# Patient Record
Sex: Male | Born: 1940 | Race: White | Hispanic: No | Marital: Married | Smoking: Former smoker
Health system: Southern US, Community
[De-identification: ages and names within clinical notes are randomized; demographics above are authoritative.]

## PROBLEM LIST (undated history)

## (undated) DIAGNOSIS — I10 Essential (primary) hypertension: Secondary | ICD-10-CM

## (undated) DIAGNOSIS — H269 Unspecified cataract: Secondary | ICD-10-CM

## (undated) DIAGNOSIS — E119 Type 2 diabetes mellitus without complications: Secondary | ICD-10-CM

## (undated) HISTORY — PX: APPENDECTOMY: SHX54

## (undated) HISTORY — DX: Essential (primary) hypertension: I10

## (undated) HISTORY — DX: Unspecified cataract: H26.9

## (undated) HISTORY — DX: Type 2 diabetes mellitus without complications: E11.9

## (undated) HISTORY — PX: EYE SURGERY: SHX253

---

## 2012-08-23 ENCOUNTER — Ambulatory Visit (INDEPENDENT_AMBULATORY_CARE_PROVIDER_SITE_OTHER): Payer: Medicare Other | Admitting: Emergency Medicine

## 2012-08-23 VITALS — BP 162/70 | HR 78 | Temp 98.2°F | Resp 16 | Ht 71.5 in | Wt 201.2 lb

## 2012-08-23 DIAGNOSIS — I10 Essential (primary) hypertension: Secondary | ICD-10-CM

## 2012-08-23 DIAGNOSIS — R040 Epistaxis: Secondary | ICD-10-CM

## 2012-08-23 NOTE — Progress Notes (Signed)
[facility] [address], [place] [address] [phone]  Date:  08/23/2012   Name:  Carlos Leach   DOB:  03/06/1941   MRN:  [id number]  PCP:  No primary provider on file.    Chief Complaint: Epistaxis   History of Present Illness:  Carlos Leach is a 72 y.o. very pleasant male patient who presents with the following:  History of chronic nose bleeds on right side.  Has experienced daily bleeds for this week.  Cauterized once as a child.  Uses ocean nasal spray daily.  No history of trauma.  No antecedent infection.  No increase in bleeding or bruising tendency   There is no problem list on file for this patient.   Past Medical History  Diagnosis Date  . Cataract   . Diabetes mellitus without complication   . Hypertension     Past Surgical History  Procedure Laterality Date  . Appendectomy    . Eye surgery      History  Substance Use Topics  . Smoking status: Former Games developer  . Smokeless tobacco: Not on file  . Alcohol Use: Yes    Family History  Problem Relation Age of Onset  . Heart disease Father     No Known Allergies  Medication list has been reviewed and updated.  No current outpatient prescriptions on file prior to visit.   No current facility-administered medications on file prior to visit.    Review of Systems:  As per HPI, otherwise negative.    Physical Examination: Filed Vitals:   08/23/12 1304  BP: 162/70  Pulse: 78  Temp: 98.2 F (36.8 C)  Resp: 16   Filed Vitals:   08/23/12 1304  Height: 5' 11.5" (1.816 m)  Weight: 201 lb 3.2 oz (91.264 kg)   Body mass index is 27.67 kg/(m^2). Ideal Body Weight: Weight in (lb) to have BMI = 25: 181.4   GEN: WDWN, NAD, Non-toxic, Alert & Oriented x 3 HEENT: Atraumatic, Normocephalic.  Ears and Nose: No external deformity.  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  PSYCH: Normally interactive. Conversant.  Not depressed or anxious appearing.  Calm demeanor.    Assessment and Plan: Anterior nose bleed Cauterized with silver nitrate  Carmelina Dane, MD

## 2012-08-23 NOTE — Patient Instructions (Addendum)

## 2013-06-07 ENCOUNTER — Encounter: Payer: Self-pay | Admitting: Podiatry

## 2013-06-07 ENCOUNTER — Ambulatory Visit (INDEPENDENT_AMBULATORY_CARE_PROVIDER_SITE_OTHER): Payer: Medicare Other | Admitting: Podiatry

## 2013-06-07 VITALS — BP 154/72 | HR 85 | Resp 16

## 2013-06-07 DIAGNOSIS — B351 Tinea unguium: Secondary | ICD-10-CM

## 2013-06-07 DIAGNOSIS — M79609 Pain in unspecified limb: Secondary | ICD-10-CM

## 2013-06-07 NOTE — Progress Notes (Signed)
Carlos Leach presents today with a chief complaint of pain toenails one through 5 bilateral.  Objective: Vital signs are stable he is alert and oriented x3 pulses are palpable bilateral. Nails are thick yellow dystrophic onychomycotic and painful palpation as well as debridement.  Assessment: Pain in limb secondary to onychomycosis 1 through 5 bilateral.  Plan: Debridement of nails 1 through 5 bilateral is cover service secondary to pain. Followup with him in 3 months.

## 2013-06-07 NOTE — Patient Instructions (Signed)
Diabetes and Foot Care Diabetes may cause you to have problems because of poor blood supply (circulation) to your feet and legs. This may cause the skin on your feet to become thinner, break easier, and heal more slowly. Your skin may become dry, and the skin may peel and crack. You may also have nerve damage in your legs and feet causing decreased feeling in them. You may not notice minor injuries to your feet that could lead to infections or more serious problems. Taking care of your feet is one of the most important things you can do for yourself.  HOME CARE INSTRUCTIONS  Wear shoes at all times, even in the house. Do not go barefoot. Bare feet are easily injured.  Check your feet daily for blisters, cuts, and redness. If you cannot see the bottom of your feet, use a mirror or ask someone for help.  Wash your feet with warm water (do not use hot water) and mild soap. Then pat your feet and the areas between your toes until they are completely dry. Do not soak your feet as this can dry your skin.  Apply a moisturizing lotion or petroleum jelly (that does not contain alcohol and is unscented) to the skin on your feet and to dry, brittle toenails. Do not apply lotion between your toes.  Trim your toenails straight across. Do not dig under them or around the cuticle. File the edges of your nails with an emery board or nail file.  Do not cut corns or calluses or try to remove them with medicine.  Wear clean socks or stockings every day. Make sure they are not too tight. Do not wear knee-high stockings since they may decrease blood flow to your legs.  Wear shoes that fit properly and have enough cushioning. To break in new shoes, wear them for just a few hours a day. This prevents you from injuring your feet. Always look in your shoes before you put them on to be sure there are no objects inside.  Do not cross your legs. This may decrease the blood flow to your feet.  If you find a minor scrape,  cut, or break in the skin on your feet, keep it and the skin around it clean and dry. These areas may be cleansed with mild soap and water. Do not cleanse the area with peroxide, alcohol, or iodine.  When you remove an adhesive bandage, be sure not to damage the skin around it.  If you have a wound, look at it several times a day to make sure it is healing.  Do not use heating pads or hot water bottles. They may burn your skin. If you have lost feeling in your feet or legs, you may not know it is happening until it is too late.  Make sure your health care provider performs a complete foot exam at least annually or more often if you have foot problems. Report any cuts, sores, or bruises to your health care provider immediately. SEEK MEDICAL CARE IF:   You have an injury that is not healing.  You have cuts or breaks in the skin.  You have an ingrown nail.  You notice redness on your legs or feet.  You feel burning or tingling in your legs or feet.  You have pain or cramps in your legs and feet.  Your legs or feet are numb.  Your feet always feel cold. SEEK IMMEDIATE MEDICAL CARE IF:   There is increasing redness,   swelling, or pain in or around a wound.  There is a red line that goes up your leg.  Pus is coming from a wound.  You develop a fever or as directed by your health care provider.  You notice a bad smell coming from an ulcer or wound. Document Released: 06/06/2000 Document Revised: 02/09/2013 Document Reviewed: 11/16/2012 ExitCare Patient Information 2014 [employer].  

## 2013-09-02 ENCOUNTER — Ambulatory Visit: Payer: Medicare Other | Admitting: Podiatry

## 2013-09-06 ENCOUNTER — Ambulatory Visit (INDEPENDENT_AMBULATORY_CARE_PROVIDER_SITE_OTHER): Payer: Medicare Other | Admitting: Podiatry

## 2013-09-06 ENCOUNTER — Encounter: Payer: Self-pay | Admitting: Podiatry

## 2013-09-06 ENCOUNTER — Ambulatory Visit: Payer: Medicare Other | Admitting: Podiatry

## 2013-09-06 VITALS — BP 148/78 | HR 80 | Resp 17 | Ht 72.0 in | Wt 190.0 lb

## 2013-09-06 DIAGNOSIS — E119 Type 2 diabetes mellitus without complications: Secondary | ICD-10-CM

## 2013-09-06 DIAGNOSIS — M79609 Pain in unspecified limb: Secondary | ICD-10-CM

## 2013-09-06 DIAGNOSIS — Q828 Other specified congenital malformations of skin: Secondary | ICD-10-CM

## 2013-09-06 DIAGNOSIS — B351 Tinea unguium: Secondary | ICD-10-CM

## 2013-09-06 NOTE — Progress Notes (Signed)
Pt presents for diabetic foot care, debridement of toenails 1 - 10, and corns..  Objective: Vital signs are stable he is alert and oriented x3. Nails are thick yellow dystrophic onychomycotic and reactive hyperkeratosis are present bilateral. Pulses are palpable bilateral.  Assessment: Pain in limb secondary to onychomycosis 1 through 5 bilateral reactive hyperkeratosis bilateral diabetes mellitus bilateral.  Plan: Debridement of all reactive hyperkeratosis and nails 1 through 5 bilateral covered service secondary to diabetes.

## 2013-12-22 ENCOUNTER — Ambulatory Visit: Payer: Medicare Other | Admitting: Podiatry

## 2014-01-31 ENCOUNTER — Ambulatory Visit: Payer: Medicare Other | Admitting: Podiatry

## 2014-02-09 ENCOUNTER — Ambulatory Visit: Payer: Medicare Other | Admitting: Podiatry

## 2014-02-14 ENCOUNTER — Encounter: Payer: Self-pay | Admitting: Podiatry

## 2014-02-14 ENCOUNTER — Ambulatory Visit (INDEPENDENT_AMBULATORY_CARE_PROVIDER_SITE_OTHER): Payer: Medicare Other | Admitting: Podiatry

## 2014-02-14 DIAGNOSIS — M79609 Pain in unspecified limb: Secondary | ICD-10-CM

## 2014-02-14 DIAGNOSIS — M79676 Pain in unspecified toe(s): Secondary | ICD-10-CM

## 2014-02-14 DIAGNOSIS — B351 Tinea unguium: Secondary | ICD-10-CM

## 2014-02-14 DIAGNOSIS — Q828 Other specified congenital malformations of skin: Secondary | ICD-10-CM

## 2014-02-14 NOTE — Progress Notes (Signed)
He presents today chief complaint of painful elongated toenails.  Objective: Pulses are strongly palpable bilateral. Neurologic sensorium is intact. Nails are thick yellow dystrophic with mycotic and painful palpation.  Assessment: Diabetes mellitus with diabetic peripheral neuropathy and painful elongated toenails one through 5 bilateral.  Plan: Debridement nails 1 through 5 bilateral.

## 2014-05-16 ENCOUNTER — Ambulatory Visit: Payer: Medicare Other | Admitting: Podiatry

## 2014-05-23 ENCOUNTER — Ambulatory Visit: Payer: Medicare Other | Admitting: Podiatry

## 2014-05-25 ENCOUNTER — Ambulatory Visit (INDEPENDENT_AMBULATORY_CARE_PROVIDER_SITE_OTHER): Payer: Medicare Other | Admitting: Podiatry

## 2014-05-25 ENCOUNTER — Encounter: Payer: Self-pay | Admitting: Podiatry

## 2014-05-25 DIAGNOSIS — M79676 Pain in unspecified toe(s): Secondary | ICD-10-CM

## 2014-05-25 DIAGNOSIS — B351 Tinea unguium: Secondary | ICD-10-CM

## 2014-05-25 NOTE — Progress Notes (Signed)
He presents today chief complaint of painful elongated toenails.  Objective: Pulses are strongly palpable bilateral. Neurologic sensorium is intact. Nails are thick yellow dystrophic with mycotic and painful palpation.  Assessment: Diabetes mellitus with diabetic peripheral neuropathy and painful elongated toenails one through 5 bilateral.  Plan: Debridement nails 1 through 5 bilateral.

## 2014-08-24 ENCOUNTER — Encounter: Payer: Self-pay | Admitting: Podiatry

## 2014-08-24 ENCOUNTER — Ambulatory Visit (INDEPENDENT_AMBULATORY_CARE_PROVIDER_SITE_OTHER): Payer: Medicare Other | Admitting: Podiatry

## 2014-08-24 DIAGNOSIS — B351 Tinea unguium: Secondary | ICD-10-CM

## 2014-08-24 DIAGNOSIS — M79676 Pain in unspecified toe(s): Secondary | ICD-10-CM

## 2014-08-24 NOTE — Patient Instructions (Signed)
Diabetes and Foot Care Diabetes may cause you to have problems because of poor blood supply (circulation) to your feet and legs. This may cause the skin on your feet to become thinner, break easier, and heal more slowly. Your skin may become dry, and the skin may peel and crack. You may also have nerve damage in your legs and feet causing decreased feeling in them. You may not notice minor injuries to your feet that could lead to infections or more serious problems. Taking care of your feet is one of the most important things you can do for yourself.  HOME CARE INSTRUCTIONS  Wear shoes at all times, even in the house. Do not go barefoot. Bare feet are easily injured.  Check your feet daily for blisters, cuts, and redness. If you cannot see the bottom of your feet, use a mirror or ask someone for help.  Wash your feet with warm water (do not use hot water) and mild soap. Then pat your feet and the areas between your toes until they are completely dry. Do not soak your feet as this can dry your skin.  Apply a moisturizing lotion or petroleum jelly (that does not contain alcohol and is unscented) to the skin on your feet and to dry, brittle toenails. Do not apply lotion between your toes.  Trim your toenails straight across. Do not dig under them or around the cuticle. File the edges of your nails with an emery board or nail file.  Do not cut corns or calluses or try to remove them with medicine.  Wear clean socks or stockings every day. Make sure they are not too tight. Do not wear knee-high stockings since they may decrease blood flow to your legs.  Wear shoes that fit properly and have enough cushioning. To break in new shoes, wear them for just a few hours a day. This prevents you from injuring your feet. Always look in your shoes before you put them on to be sure there are no objects inside.  Do not cross your legs. This may decrease the blood flow to your feet.  If you find a minor scrape,  cut, or break in the skin on your feet, keep it and the skin around it clean and dry. These areas may be cleansed with mild soap and water. Do not cleanse the area with peroxide, alcohol, or iodine.  When you remove an adhesive bandage, be sure not to damage the skin around it.  If you have a wound, look at it several times a day to make sure it is healing.  Do not use heating pads or hot water bottles. They may burn your skin. If you have lost feeling in your feet or legs, you may not know it is happening until it is too late.  Make sure your health care provider performs a complete foot exam at least annually or more often if you have foot problems. Report any cuts, sores, or bruises to your health care provider immediately. SEEK MEDICAL CARE IF:   You have an injury that is not healing.  You have cuts or breaks in the skin.  You have an ingrown nail.  You notice redness on your legs or feet.  You feel burning or tingling in your legs or feet.  You have pain or cramps in your legs and feet.  Your legs or feet are numb.  Your feet always feel cold. SEEK IMMEDIATE MEDICAL CARE IF:   There is increasing redness,   swelling, or pain in or around a wound.  There is a red line that goes up your leg.  Pus is coming from a wound.  You develop a fever or as directed by your health care provider.  You notice a bad smell coming from an ulcer or wound. Document Released: 06/06/2000 Document Revised: 02/09/2013 Document Reviewed: 11/16/2012 ExitCare Patient Information 2015 [employer]. This information is not intended to replace advice given to you by your health care provider. Make sure you discuss any questions you have with your health care provider.  

## 2014-08-24 NOTE — Progress Notes (Signed)
She presents today with a chief complaint of painful elongated toenails bilateral.  Objective: He has a thick yellow dystrophic onychomycotic and pulses are palpable bilateral.  Assessment: Pain in the second onychomycosis.  Plan: Debridement of nails thickness and length discovered some secondary to pain.

## 2014-11-23 ENCOUNTER — Encounter: Payer: Self-pay | Admitting: Podiatry

## 2014-11-23 ENCOUNTER — Ambulatory Visit (INDEPENDENT_AMBULATORY_CARE_PROVIDER_SITE_OTHER): Payer: Medicare Other | Admitting: Podiatry

## 2014-11-23 DIAGNOSIS — B351 Tinea unguium: Secondary | ICD-10-CM | POA: Diagnosis not present

## 2014-11-23 DIAGNOSIS — Q828 Other specified congenital malformations of skin: Secondary | ICD-10-CM | POA: Diagnosis not present

## 2014-11-23 DIAGNOSIS — M79676 Pain in unspecified toe(s): Secondary | ICD-10-CM

## 2014-11-23 NOTE — Progress Notes (Signed)
Patient ID: Carlos Leach, male   DOB: 11/07/1940, 74 y.o.   MRN: [id number] Complaint:  Visit Type: Patient returns to my office for continued preventative foot care services. Complaint: Patient states" my nails have grown long and thick and become painful to walk and wear shoes" Patient has been diagnosed with DM with no complications. He presents for preventative foot care services. No changes to ROS  Podiatric Exam: Vascular: dorsalis pedis and posterior tibial pulses are palpable bilateral. Capillary return is immediate. Temperature gradient is WNL. Skin turgor WNL  Sensorium: Normal Semmes Weinstein monofilament test. Normal tactile sensation bilaterally. Nail Exam: Pt has thick disfigured discolored nails with subungual debris noted bilateral entire nail hallux through fifth toenails Ulcer Exam: There is no evidence of ulcer or pre-ulcerative changes or infection. Orthopedic Exam: Muscle tone and strength are WNL. No limitations in general ROM. No crepitus or effusions noted. Foot type and digits show no abnormalities. Bony prominences are unremarkable. Skin:  Porokeratosis sub 5th left foot and sub 4th right foot. No infection or ulcers  Diagnosis:  Tinea unguium, Pain in right toe, pain in left toes, Porokeratosis  Treatment & Plan Procedures and Treatment: Consent by patient was obtained for treatment procedures. The patient understood the discussion of treatment and procedures well. All questions were answered thoroughly reviewed. Debridement of mycotic and hypertrophic toenails, 1 through 5 bilateral and clearing of subungual debris. No ulceration, no infection noted.  Debridement of porokeratosisReturn Visit-Office Procedure: Patient instructed to return to the office for a follow up visit 3 months for continued evaluation and treatment.

## 2015-02-22 DEATH — deceased

## 2015-03-01 ENCOUNTER — Ambulatory Visit (INDEPENDENT_AMBULATORY_CARE_PROVIDER_SITE_OTHER): Payer: Medicare Other | Admitting: Podiatry

## 2015-03-01 ENCOUNTER — Encounter: Payer: Self-pay | Admitting: Podiatry

## 2015-03-01 DIAGNOSIS — M79676 Pain in unspecified toe(s): Secondary | ICD-10-CM | POA: Diagnosis not present

## 2015-03-01 DIAGNOSIS — Q828 Other specified congenital malformations of skin: Secondary | ICD-10-CM

## 2015-03-01 DIAGNOSIS — B351 Tinea unguium: Secondary | ICD-10-CM | POA: Diagnosis not present

## 2015-03-01 NOTE — Progress Notes (Signed)
Patient ID: Carlos Leach, male   DOB: 10/16/1940, 74 y.o.   MRN: [id number] Complaint:  Visit Type: Patient returns to my office for continued preventative foot care services. Complaint: Patient states" my nails have grown long and thick and become painful to walk and wear shoes" Patient has been diagnosed with DM with no complications. He presents for preventative foot care services. No changes to ROS  Podiatric Exam: Vascular: dorsalis pedis and posterior tibial pulses are palpable bilateral. Capillary return is immediate. Temperature gradient is WNL. Skin turgor WNL  Sensorium: Normal Semmes Weinstein monofilament test. Normal tactile sensation bilaterally. Nail Exam: Pt has thick disfigured discolored nails with subungual debris noted bilateral entire nail hallux through fifth toenails Ulcer Exam: There is no evidence of ulcer or pre-ulcerative changes or infection. Orthopedic Exam: Muscle tone and strength are WNL. No limitations in general ROM. No crepitus or effusions noted. Foot type and digits show no abnormalities. Bony prominences are unremarkable. Skin:  Porokeratosis sub 5th left foot and sub 4th right foot. No infection or ulcers  Diagnosis:  Tinea unguium, Pain in right toe, pain in left toes, Porokeratosis  Treatment & Plan Procedures and Treatment: Consent by patient was obtained for treatment procedures. The patient understood the discussion of treatment and procedures well. All questions were answered thoroughly reviewed. Debridement of mycotic and hypertrophic toenails, 1 through 5 bilateral and clearing of subungual debris. No ulceration, no infection noted.  Debridement of porokeratosisReturn Visit-Office Procedure: Patient instructed to return to the office for a follow up visit 3 months for continued evaluation and treatment. 

## 2015-05-30 ENCOUNTER — Encounter: Payer: Self-pay | Admitting: Podiatry

## 2015-05-30 ENCOUNTER — Ambulatory Visit (INDEPENDENT_AMBULATORY_CARE_PROVIDER_SITE_OTHER): Payer: Medicare Other | Admitting: Podiatry

## 2015-05-30 DIAGNOSIS — M79676 Pain in unspecified toe(s): Secondary | ICD-10-CM

## 2015-05-30 DIAGNOSIS — B351 Tinea unguium: Secondary | ICD-10-CM

## 2015-05-30 DIAGNOSIS — Q828 Other specified congenital malformations of skin: Secondary | ICD-10-CM | POA: Diagnosis not present

## 2015-05-30 NOTE — Progress Notes (Signed)
Patient ID: Carlos Leach, male   DOB: [birth year], 74 y.o.   MRN: [id number] Complaint:  Visit Type: Patient returns to my office for continued preventative foot care services. Complaint: Patient states" my nails have grown long and thick and become painful to walk and wear shoes" Patient has been diagnosed with DM with no complications. He presents for preventative foot care services. No changes to ROS  Podiatric Exam: Vascular: dorsalis pedis and posterior tibial pulses are palpable bilateral. Capillary return is immediate. Temperature gradient is WNL. Skin turgor WNL  Sensorium: Normal Semmes Weinstein monofilament test. Normal tactile sensation bilaterally. Nail Exam: Pt has thick disfigured discolored nails with subungual debris noted bilateral entire nail hallux through fifth toenails Ulcer Exam: There is no evidence of ulcer or pre-ulcerative changes or infection. Orthopedic Exam: Muscle tone and strength are WNL. No limitations in general ROM. No crepitus or effusions noted. Foot type and digits show no abnormalities. Bony prominences are unremarkable. Skin:  Porokeratosis sub 5th left foot and sub 4th right foot. No infection or ulcers  Diagnosis:  Tinea unguium, Pain in right toe, pain in left toes, Porokeratosis  Treatment & Plan Procedures and Treatment: Consent by patient was obtained for treatment procedures. The patient understood the discussion of treatment and procedures well. All questions were answered thoroughly reviewed. Debridement of mycotic and hypertrophic toenails, 1 through 5 bilateral and clearing of subungual debris. No ulceration, no infection noted.  Debridement of porokeratosisReturn Visit-Office Procedure: Patient instructed to return to the office for a follow up visit 3 months for continued evaluation and treatment.  Gardiner Barefoot DPM

## 2015-08-22 ENCOUNTER — Encounter: Payer: Self-pay | Admitting: Podiatry

## 2015-08-22 ENCOUNTER — Ambulatory Visit (INDEPENDENT_AMBULATORY_CARE_PROVIDER_SITE_OTHER): Payer: Medicare Other | Admitting: Podiatry

## 2015-08-22 DIAGNOSIS — Q828 Other specified congenital malformations of skin: Secondary | ICD-10-CM | POA: Diagnosis not present

## 2015-08-22 DIAGNOSIS — M79676 Pain in unspecified toe(s): Secondary | ICD-10-CM

## 2015-08-22 NOTE — Progress Notes (Signed)
Subjective:     Patient ID: Carlos Leach, male   DOB: July 04, 1940, 75 y.o.   MRN: [id number]  HPI this patient returns to the office with complaint of painful callus on the ball of both feet. Patient states the calluses redevelop and he is very active walking. He says the calluses become painful and sore after a few months. He presents the office today for evaluation and treatment of this condition   Review of Systems     Objective:   Physical Exam GENERAL APPEARANCE: Alert, conversant. Appropriately groomed. No acute distress.  VASCULAR: Pedal pulses palpable at  [facility] and PT bilateral.  Capillary refill time is immediate to all digits,  Normal temperature gradient.  Digital hair growth is present bilateral  NEUROLOGIC: sensation is normal to 5.07 monofilament at 5/5 sites bilateral.  Light touch is intact bilateral, Muscle strength normal.  MUSCULOSKELETAL: acceptable muscle strength, tone and stability bilateral.  Intrinsic muscluature intact bilateral.  Rectus appearance of foot and digits noted bilateral.   DERMATOLOGIC: skin color, texture, and turgor are within normal limits.  No preulcerative lesions or ulcers  are seen, no interdigital maceration noted.  No open lesions present.  Digital nails are asymptomatic. No drainage noted. Porokeratosis sub 4,5 right and sub 1 right foot.  Porokeratosis sub 2 left foot.      Assessment:     Porokratosis B/L     Plan:     Debri9dement of porokeratosis  RTC 3 months.   Gardiner Barefoot DPM

## 2015-11-21 ENCOUNTER — Encounter: Payer: Self-pay | Admitting: Podiatry

## 2015-11-21 ENCOUNTER — Ambulatory Visit (INDEPENDENT_AMBULATORY_CARE_PROVIDER_SITE_OTHER): Payer: Medicare Other | Admitting: Podiatry

## 2015-11-21 DIAGNOSIS — M79676 Pain in unspecified toe(s): Secondary | ICD-10-CM

## 2015-11-21 DIAGNOSIS — Q828 Other specified congenital malformations of skin: Secondary | ICD-10-CM

## 2015-11-21 DIAGNOSIS — B351 Tinea unguium: Secondary | ICD-10-CM | POA: Diagnosis not present

## 2015-11-21 NOTE — Progress Notes (Signed)
Subjective:     Patient ID: Carlos Leach, male   DOB: 15-May-1941, 75 y.o.   MRN: [id number]  HPI this patient returns to the office with complaint of painful callus on the ball of left  feet. Patient states the calluses redevelop and he is very active walking. He says the calluses become painful and sore after a few months. He presents the office today for evaluation and treatment of this condition.  He also says his nails have grown long and thick and are painful walking and wearing his shoes.  He presents for preventive foot care services.   Review of Systems     Objective:   Physical Exam GENERAL APPEARANCE: Alert, conversant. Appropriately groomed. No acute distress.  VASCULAR: Pedal pulses palpable at  [facility] and PT bilateral.  Capillary refill time is immediate to all digits,  Normal temperature gradient.  Digital hair growth is present bilateral  NEUROLOGIC: sensation is normal to 5.07 monofilament at 5/5 sites bilateral.  Light touch is intact bilateral, Muscle strength normal.  MUSCULOSKELETAL: acceptable muscle strength, tone and stability bilateral.  Intrinsic muscluature intact bilateral.  Severe HAV deformities B/L.   DERMATOLOGIC: skin color, texture, and turgor are within normal limits.  No preulcerative lesions or ulcers  are seen, no interdigital maceration noted.  No open lesions present.  No drainage noted. Porokeratosis sub 4 right.      Assessment:     Porokeratosis Right foot.   Onychomycosis B/L    Plan:     Debridement of porokeratosis  Debridement of nails both feet. RTC 3 months.   Gardiner Barefoot DPM

## 2016-02-13 ENCOUNTER — Ambulatory Visit: Payer: Medicare Other | Admitting: Podiatry

## 2016-02-28 ENCOUNTER — Ambulatory Visit (INDEPENDENT_AMBULATORY_CARE_PROVIDER_SITE_OTHER): Payer: Medicare Other | Admitting: Podiatry

## 2016-02-28 DIAGNOSIS — B351 Tinea unguium: Secondary | ICD-10-CM | POA: Diagnosis not present

## 2016-02-28 DIAGNOSIS — M79676 Pain in unspecified toe(s): Secondary | ICD-10-CM

## 2016-02-28 DIAGNOSIS — Q828 Other specified congenital malformations of skin: Secondary | ICD-10-CM

## 2016-02-28 NOTE — Progress Notes (Signed)
Subjective:     Patient ID: Carlos Leach, male   DOB: 05-31-1941, 75 y.o.   MRN: [id number]  HPI this patient returns to the office with complaint of painful callus on the ball of left  feet. Patient states the calluses redevelop and he is very active walking. He says the calluses become painful and sore after a few months. He presents the office today for evaluation and treatment of this condition.  He also says his nails have grown long and thick and are painful walking and wearing his shoes.  He presents for preventive foot care services.   Review of Systems     Objective:   Physical Exam GENERAL APPEARANCE: Alert, conversant. Appropriately groomed. No acute distress.  VASCULAR: Pedal pulses palpable at  [facility] and PT bilateral.  Capillary refill time is immediate to all digits,  Normal temperature gradient.  Digital hair growth is present bilateral  NEUROLOGIC: sensation is normal to 5.07 monofilament at 5/5 sites bilateral.  Light touch is intact bilateral, Muscle strength normal.  MUSCULOSKELETAL: acceptable muscle strength, tone and stability bilateral.  Intrinsic muscluature intact bilateral.  Severe HAV deformities B/L.   DERMATOLOGIC: skin color, texture, and turgor are within normal limits.  No preulcerative lesions or ulcers  are seen, no interdigital maceration noted.  No open lesions present.  No drainage noted. Porokeratosis sub 4 right.      Assessment:     Porokeratosis Right foot.   Onychomycosis B/L    Plan:     Debridement of porokeratosis  Debridement of nails both feet. RTC 3 months.   Gardiner Barefoot DPM

## 2016-05-14 ENCOUNTER — Encounter: Payer: Self-pay | Admitting: Podiatry

## 2016-05-14 ENCOUNTER — Ambulatory Visit (INDEPENDENT_AMBULATORY_CARE_PROVIDER_SITE_OTHER): Payer: Medicare Other | Admitting: Podiatry

## 2016-05-14 VITALS — Resp 14 | Ht 72.0 in | Wt 190.0 lb

## 2016-05-14 DIAGNOSIS — Q828 Other specified congenital malformations of skin: Secondary | ICD-10-CM | POA: Diagnosis not present

## 2016-05-14 NOTE — Progress Notes (Signed)
Subjective:     Patient ID: Carlos Leach, male   DOB: 13-Mar-1941, 75 y.o.   MRN: [id number]  HPI this patient returns to the office with complaint of painful callus on the ball of left  feet. Patient states the calluses redevelop and he is very active walking. He says the calluses become painful and sore after a few months. He presents the office today for evaluation and treatment of this condition.  He also says his nails have grown long and thick and are painful walking and wearing his shoes.  He presents for preventive foot care services.   Review of Systems     Objective:   Physical Exam GENERAL APPEARANCE: Alert, conversant. Appropriately groomed. No acute distress.  VASCULAR: Pedal pulses palpable at  [facility] and PT bilateral.  Capillary refill time is immediate to all digits,  Normal temperature gradient.  Digital hair growth is present bilateral  NEUROLOGIC: sensation is normal to 5.07 monofilament at 5/5 sites bilateral.  Light touch is intact bilateral, Muscle strength normal.  MUSCULOSKELETAL: acceptable muscle strength, tone and stability bilateral.  Intrinsic muscluature intact bilateral.  Severe HAV deformities B/L.   DERMATOLOGIC: skin color, texture, and turgor are within normal limits.  No preulcerative lesions or ulcers  are seen, no interdigital maceration noted.  No open lesions present.  No drainage noted. Porokeratosis sub 4 right.      Assessment:     Porokeratosis Right foot.   Onychomycosis B/L    Plan:     Debridement of porokeratosis  Debridement of nails both feet. RTC 3 months.   Gardiner Barefoot DPM

## 2016-05-22 ENCOUNTER — Ambulatory Visit: Payer: Medicare Other | Admitting: Podiatry

## 2016-08-13 ENCOUNTER — Ambulatory Visit (INDEPENDENT_AMBULATORY_CARE_PROVIDER_SITE_OTHER): Payer: Medicare Other | Admitting: Podiatry

## 2016-08-13 ENCOUNTER — Encounter: Payer: Self-pay | Admitting: Podiatry

## 2016-08-13 DIAGNOSIS — Q828 Other specified congenital malformations of skin: Secondary | ICD-10-CM | POA: Diagnosis not present

## 2016-08-13 NOTE — Progress Notes (Signed)
Subjective:     Patient ID: Carlos Leach, male   DOB: 1940/11/12, 76 y.o.   MRN: [id number]  HPI this patient returns to the office with complaint of painful callus on the ball of left  feet. Patient states the calluses redevelop and he is very active walking. He says the calluses become painful and sore after a few months. He presents the office today for evaluation and treatment of this condition.  He also says his nails have grown long and thick and are painful walking and wearing his shoes.  He presents for preventive foot care services.   Review of Systems     Objective:   Physical Exam GENERAL APPEARANCE: Alert, conversant. Appropriately groomed. No acute distress.  VASCULAR: Pedal pulses palpable at  [facility] and PT bilateral.  Capillary refill time is immediate to all digits,  Normal temperature gradient.  Digital hair growth is present bilateral  NEUROLOGIC: sensation is normal to 5.07 monofilament at 5/5 sites bilateral.  Light touch is intact bilateral, Muscle strength normal.  MUSCULOSKELETAL: acceptable muscle strength, tone and stability bilateral.  Intrinsic muscluature intact bilateral.  Severe HAV deformities B/L.   DERMATOLOGIC: skin color, texture, and turgor are within normal limits.  No preulcerative lesions or ulcers  are seen, no interdigital maceration noted.  No open lesions present.  No drainage noted. Porokeratosis sub 4 right.      Assessment:     Porokeratosis Right foot.      Plan:     Debridement of porokeratosis  RTC 3 months.   Gardiner Barefoot DPM

## 2016-10-30 ENCOUNTER — Encounter: Payer: Self-pay | Admitting: Podiatry

## 2016-10-30 ENCOUNTER — Ambulatory Visit (INDEPENDENT_AMBULATORY_CARE_PROVIDER_SITE_OTHER): Payer: Medicare Other | Admitting: Podiatry

## 2016-10-30 DIAGNOSIS — Q828 Other specified congenital malformations of skin: Secondary | ICD-10-CM

## 2016-10-30 NOTE — Progress Notes (Signed)
Subjective:     Patient ID: Carlos Leach, male   DOB: 1940-09-02, 76 y.o.   MRN: [id number]  HPI this patient returns to the office with complaint of painful callus on the ball of left  feet. Patient states the calluses redevelop and he is very active walking. He says the calluses become painful and sore after a few months. He presents the office today for evaluation and treatment of this condition.    He presents for preventive foot care services.   Review of Systems     Objective:   Physical Exam GENERAL APPEARANCE: Alert, conversant. Appropriately groomed. No acute distress.  VASCULAR: Pedal pulses palpable at  [facility] and PT bilateral.  Capillary refill time is immediate to all digits,  Normal temperature gradient.   NEUROLOGIC: sensation is normal to 5.07 monofilament at 5/5 sites bilateral.  Light touch is intact bilateral, Muscle strength normal.  MUSCULOSKELETAL: acceptable muscle strength, tone and stability bilateral.  Intrinsic muscluature intact bilateral.  Severe HAV deformities B/L.   DERMATOLOGIC: skin color, texture, and turgor are within normal limits.  No preulcerative lesions or ulcers  are seen, no interdigital maceration noted.  No open lesions present.  No drainage noted. Porokeratosis sub 4 right. Porokeratosis sub 2 left. Normal nail growth.      Assessment:     Porokeratosis B/L      Plan:     Debridement of porokeratosis  RTC 3 months.   Gardiner Barefoot DPM

## 2017-02-03 ENCOUNTER — Ambulatory Visit (INDEPENDENT_AMBULATORY_CARE_PROVIDER_SITE_OTHER): Payer: Medicare Other | Admitting: Podiatry

## 2017-02-03 DIAGNOSIS — E1142 Type 2 diabetes mellitus with diabetic polyneuropathy: Secondary | ICD-10-CM

## 2017-02-03 DIAGNOSIS — Q828 Other specified congenital malformations of skin: Secondary | ICD-10-CM | POA: Diagnosis not present

## 2017-02-03 NOTE — Progress Notes (Signed)
Subjective:     Patient ID: Carlos Leach, male   DOB: 03/05/1941, 76 y.o.   MRN: [id number]  HPI this patient returns to the office with complaint of painful callus on the ball of left  feet. Patient states the calluses redevelop and he is very active walking. He says the calluses become painful and sore after a few months. He presents the office today for evaluation and treatment of this condition.    He presents for preventive foot care services. He is diabetic requesting foot  Diabetic foot exam.   Review of Systems     Objective:   Physical Exam GENERAL APPEARANCE: Alert, conversant. Appropriately groomed. No acute distress.  VASCULAR: Pedal pulses palpable at  [facility] and PT bilateral.  Capillary refill time is immediate to all digits,  Normal temperature gradient.   NEUROLOGIC: sensation is normal to 5.07 monofilament at 5/5 sites right.  3/5 LOPS left foot.  Light touch is intact bilateral, Muscle strength normal.  MUSCULOSKELETAL: acceptable muscle strength, tone and stability bilateral.  Intrinsic muscluature intact bilateral.  Severe HAV deformities B/L.   DERMATOLOGIC: skin color, texture, and turgor are within normal limits.  No preulcerative lesions or ulcers  are seen, no interdigital maceration noted.  No open lesions present.  No drainage noted. Porokeratosis sub 4 right. Porokeratosis sub 5 left. Normal nail growth.      Assessment:     Porokeratosis B/L      Plan:     Debridement of porokeratosis  RTC 3 months.   Taleisha Kaczynski DPM       

## 2017-05-06 ENCOUNTER — Ambulatory Visit: Payer: Medicare Other | Admitting: Podiatry

## 2017-05-06 ENCOUNTER — Encounter: Payer: Self-pay | Admitting: Podiatry

## 2017-05-06 DIAGNOSIS — Q828 Other specified congenital malformations of skin: Secondary | ICD-10-CM | POA: Diagnosis not present

## 2017-05-06 DIAGNOSIS — E1142 Type 2 diabetes mellitus with diabetic polyneuropathy: Secondary | ICD-10-CM

## 2017-05-06 NOTE — Progress Notes (Signed)
Subjective:     Patient ID: Carlos Leach, male   DOB: 08-Feb-1941, 76 y.o.   MRN: [id number]  HPI this patient returns to the office with complaint of painful callus on the ball of left  feet. Patient states the calluses redevelop and he is very active walking. He says the calluses become painful and sore after a few months. He presents the office today for evaluation and treatment of this condition.    He presents for preventive foot care services. He is diabetic requesting foot  Diabetic foot exam.   Review of Systems     Objective:   Physical Exam GENERAL APPEARANCE: Alert, conversant. Appropriately groomed. No acute distress.  VASCULAR: Pedal pulses palpable at  [facility] and PT bilateral.  Capillary refill time is immediate to all digits,  Normal temperature gradient.   NEUROLOGIC: sensation is normal to 5.07 monofilament at 5/5 sites right.  3/5 LOPS left foot.  Light touch is intact bilateral, Muscle strength normal.  MUSCULOSKELETAL: acceptable muscle strength, tone and stability bilateral.  Intrinsic muscluature intact bilateral.  Severe HAV deformities B/L.   DERMATOLOGIC: skin color, texture, and turgor are within normal limits.  No preulcerative lesions or ulcers  are seen, no interdigital maceration noted.  No open lesions present.  No drainage noted. Porokeratosis sub 4 right. Porokeratosis sub 5 left. Normal nail growth.      Assessment:     Porokeratosis B/L      Plan:     Debridement of porokeratosis  RTC 3 months.   Gardiner Barefoot DPM

## 2017-07-15 ENCOUNTER — Encounter: Payer: Self-pay | Admitting: Podiatry

## 2017-07-15 ENCOUNTER — Ambulatory Visit: Payer: Medicare Other | Admitting: Podiatry

## 2017-07-15 DIAGNOSIS — E1142 Type 2 diabetes mellitus with diabetic polyneuropathy: Secondary | ICD-10-CM | POA: Diagnosis not present

## 2017-07-15 DIAGNOSIS — Q828 Other specified congenital malformations of skin: Secondary | ICD-10-CM

## 2017-07-15 NOTE — Progress Notes (Signed)
Subjective:     Patient ID: Carlos Leach, male   DOB: 1941-02-26, 77 y.o.   MRN: [id number]  HPI this patient returns to the office with complaint of painful callus on the ball of left  feet. Patient states the calluses redevelop and he is very active walking. He says the calluses become painful and sore after a few months. He presents the office today for evaluation and treatment of this condition.    He presents for preventive foot care services.   Review of Systems     Objective:   Physical Exam GENERAL APPEARANCE: Alert, conversant. Appropriately groomed. No acute distress.  VASCULAR: Pedal pulses palpable at  [facility] and PT bilateral.  Capillary refill time is immediate to all digits,  Normal temperature gradient.   NEUROLOGIC: sensation is normal to 5.07 monofilament at 5/5 sites bilateral.  Light touch is intact bilateral, Muscle strength normal.  MUSCULOSKELETAL: acceptable muscle strength, tone and stability bilateral.  Intrinsic muscluature intact bilateral.  Severe HAV deformities B/L.   DERMATOLOGIC: skin color, texture, and turgor are within normal limits.  No preulcerative lesions or ulcers  are seen, no interdigital maceration noted.  No open lesions present.  No drainage noted. Porokeratosis sub 4 right. Porokeratosis sub 5 left. Normal nail growth.      Assessment:     Porokeratosis B/L      Plan:     Debridement of porokeratosis  RTC 9 weeks   Gardiner Barefoot DPM

## 2017-09-16 ENCOUNTER — Ambulatory Visit: Payer: Medicare Other | Admitting: Podiatry

## 2017-09-30 ENCOUNTER — Ambulatory Visit: Payer: Medicare Other | Admitting: Podiatry

## 2017-09-30 ENCOUNTER — Encounter: Payer: Self-pay | Admitting: Podiatry

## 2017-09-30 DIAGNOSIS — E1142 Type 2 diabetes mellitus with diabetic polyneuropathy: Secondary | ICD-10-CM

## 2017-09-30 DIAGNOSIS — Q828 Other specified congenital malformations of skin: Secondary | ICD-10-CM | POA: Diagnosis not present

## 2017-09-30 DIAGNOSIS — M79676 Pain in unspecified toe(s): Secondary | ICD-10-CM

## 2017-09-30 DIAGNOSIS — B351 Tinea unguium: Secondary | ICD-10-CM

## 2017-09-30 NOTE — Progress Notes (Signed)
Complaint:  Visit Type: Patient returns to my office for continued preventative foot care services. Complaint: Patient states" my nails have grown long and thick and become painful to walk and wear shoes"  Patient also has painful callus both feet. The patient presents for preventative foot care services. No changes to ROS  Podiatric Exam: Vascular: dorsalis pedis and posterior tibial pulses are palpable bilateral. Capillary return is immediate. Temperature gradient is WNL. Skin turgor WNL  Sensorium: Normal Semmes Weinstein monofilament test. Normal tactile sensation bilaterally. Nail Exam: Pt has thick disfigured discolored nails with subungual debris noted bilateral entire nail hallux through fifth toenails Ulcer Exam: There is no evidence of ulcer or pre-ulcerative changes or infection. Orthopedic Exam: Muscle tone and strength are WNL. No limitations in general ROM. No crepitus or effusions noted. Foot type and digits show no abnormalities. Bony prominences are unremarkable. Skin:  Porokeratosis sub 5 left and sub 4 right.. No infection or ulcers  Diagnosis:  Onychomycosis, , Pain in right toe, pain in left toes  Porokeratosis  B/L  Treatment & Plan Procedures and Treatment: Consent by patient was obtained for treatment procedures.   Debridement of mycotic and hypertrophic toenails, 1 through 5 bilateral and clearing of subungual debris. No ulceration, no infection noted. Debridement of porokeratosis. Return Visit-Office Procedure: Patient instructed to return to the office for a follow up visit 3 months for continued evaluation and treatment.    Gardiner Barefoot DPM

## 2017-12-02 ENCOUNTER — Ambulatory Visit: Payer: Medicare Other | Admitting: Podiatry

## 2017-12-02 ENCOUNTER — Encounter: Payer: Self-pay | Admitting: Podiatry

## 2017-12-02 DIAGNOSIS — M2011 Hallux valgus (acquired), right foot: Secondary | ICD-10-CM

## 2017-12-02 DIAGNOSIS — M2012 Hallux valgus (acquired), left foot: Secondary | ICD-10-CM

## 2017-12-02 DIAGNOSIS — E1142 Type 2 diabetes mellitus with diabetic polyneuropathy: Secondary | ICD-10-CM

## 2017-12-02 DIAGNOSIS — Q828 Other specified congenital malformations of skin: Secondary | ICD-10-CM | POA: Diagnosis not present

## 2017-12-02 DIAGNOSIS — M2140 Flat foot [pes planus] (acquired), unspecified foot: Secondary | ICD-10-CM

## 2017-12-02 NOTE — Progress Notes (Signed)
Complaint:  Visit Type: Patient returns to my office for continued preventative foot care services. Complaint: Patient states" my nails have grown long and thick and become painful to walk and wear shoes"  Patient also has painful callus left foot. The patient presents for preventative foot care services. No changes to ROS  Podiatric Exam: Vascular: dorsalis pedis and posterior tibial pulses are palpable bilateral. Capillary return is immediate. Temperature gradient is WNL. Skin turgor WNL  Sensorium: Diminished  Semmes Weinstein monofilament test. Normal tactile sensation bilaterally. Nail Exam:  Normal nails noted with no signs of redness or swelling or infection. Ulcer Exam: There is no evidence of ulcer or pre-ulcerative changes or infection. Orthopedic Exam: Muscle tone and strength are WNL. No limitations in general ROM. No crepitus or effusions noted. Foot type and digits show no abnormalities. Bony prominences are unremarkable. Skin:  Porokeratosis sub 5 left . No infection or ulcers  Diagnosis:  Onychomycosis, , Pain in right toe, pain in left toes  Porokeratosis  Left.  Treatment & Plan Procedures and Treatment: Consent by patient was obtained for treatment procedures.   Debridement of mycotic and hypertrophic toenails, 1 through 5 bilateral and clearing of subungual debris. No ulceration, no infection noted. Debridement of porokeratosis left foot.  Patient developed blister under 1st MPJ right from marching in a parade.  Patient requests diabetic shoes.  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 Patient to be seen by pedorthist. Return Visit-Office Procedure: Patient instructed to return to the office for a follow up visit 3 months for continued evaluation and treatment.    Gardiner Barefoot DPM

## 2017-12-08 ENCOUNTER — Ambulatory Visit: Payer: Medicare Other | Admitting: Orthotics

## 2017-12-08 DIAGNOSIS — M2012 Hallux valgus (acquired), left foot: Secondary | ICD-10-CM

## 2017-12-08 DIAGNOSIS — Q828 Other specified congenital malformations of skin: Secondary | ICD-10-CM

## 2017-12-08 DIAGNOSIS — M2140 Flat foot [pes planus] (acquired), unspecified foot: Secondary | ICD-10-CM

## 2017-12-08 DIAGNOSIS — E1142 Type 2 diabetes mellitus with diabetic polyneuropathy: Secondary | ICD-10-CM

## 2017-12-08 NOTE — Progress Notes (Signed)
Patient presents today for diabetic shoe measurement and foam casting.  Goals of diabetic shoes/inserts to offer protection from conditions secondary to DM2, offer relief from sheer forces that could lead to ulcerations, protect the foot, and offer greater stability. Patient is under supervision of DPM Mayer Physician managing patients DM2: Ross Patient has following documented conditions to qualify for diabetic shoes/inserts: Dm2, PN, HAV Patient measured with brannock device: 11w  Chose [id number]

## 2018-01-11 ENCOUNTER — Ambulatory Visit (INDEPENDENT_AMBULATORY_CARE_PROVIDER_SITE_OTHER): Payer: Medicare Other | Admitting: Orthotics

## 2018-01-11 DIAGNOSIS — M2012 Hallux valgus (acquired), left foot: Secondary | ICD-10-CM

## 2018-01-11 DIAGNOSIS — M2011 Hallux valgus (acquired), right foot: Secondary | ICD-10-CM

## 2018-01-11 DIAGNOSIS — M2140 Flat foot [pes planus] (acquired), unspecified foot: Secondary | ICD-10-CM

## 2018-01-11 DIAGNOSIS — E1142 Type 2 diabetes mellitus with diabetic polyneuropathy: Secondary | ICD-10-CM

## 2018-01-11 NOTE — Progress Notes (Signed)

## 2018-02-10 ENCOUNTER — Encounter: Payer: Self-pay | Admitting: Podiatry

## 2018-02-10 ENCOUNTER — Ambulatory Visit: Payer: Medicare Other | Admitting: Podiatry

## 2018-02-10 DIAGNOSIS — M79674 Pain in right toe(s): Secondary | ICD-10-CM | POA: Diagnosis not present

## 2018-02-10 DIAGNOSIS — M79675 Pain in left toe(s): Secondary | ICD-10-CM

## 2018-02-10 DIAGNOSIS — B351 Tinea unguium: Secondary | ICD-10-CM | POA: Diagnosis not present

## 2018-02-10 NOTE — Progress Notes (Signed)
Complaint:  Visit Type: Patient returns to my office for continued preventative foot care services. Complaint: Patient states" my nails have grown long and thick and become painful to walk and wear shoes"  Patient also says his callus have improved. The patient presents for preventative foot care services. No changes to ROS  Podiatric Exam: Vascular: dorsalis pedis and posterior tibial pulses are palpable bilateral. Capillary return is immediate. Temperature gradient is WNL. Skin turgor WNL  Sensorium: Diminished  Semmes Weinstein monofilament test. Normal tactile sensation bilaterally. Nail Exam:  Long thick ingrown toenails both feet. Ulcer Exam: There is no evidence of ulcer or pre-ulcerative changes or infection. Orthopedic Exam: Muscle tone and strength are WNL. No limitations in general ROM. No crepitus or effusions noted. Foot type and digits show no abnormalities. Bony prominences are unremarkable. Skin:  Porokeratosis sub 5 left asymptomatic. Marland Kitchen No infection or ulcers  Diagnosis:  Onychomycosis, , Pain in right toe, pain in left toes  Porokeratosis  Left.  Treatment & Plan Procedures and Treatment: Consent by patient was obtained for treatment procedures.   Debridement of mycotic and hypertrophic toenails, 1 through 5 bilateral and clearing of subungual debris. No ulceration, no infection noted. Debridement of porokeratosis left foot.   Return Visit-Office Procedure: Patient instructed to return to the office for a follow up visit 3 months for continued evaluation and treatment.    Gardiner Barefoot DPM

## 2018-04-21 ENCOUNTER — Encounter: Payer: Self-pay | Admitting: Podiatry

## 2018-04-21 ENCOUNTER — Ambulatory Visit: Payer: Medicare Other | Admitting: Podiatry

## 2018-04-21 DIAGNOSIS — B351 Tinea unguium: Secondary | ICD-10-CM

## 2018-04-21 DIAGNOSIS — M79674 Pain in right toe(s): Secondary | ICD-10-CM | POA: Diagnosis not present

## 2018-04-21 DIAGNOSIS — Q828 Other specified congenital malformations of skin: Secondary | ICD-10-CM | POA: Diagnosis not present

## 2018-04-21 DIAGNOSIS — M79675 Pain in left toe(s): Secondary | ICD-10-CM | POA: Diagnosis not present

## 2018-04-21 DIAGNOSIS — E1142 Type 2 diabetes mellitus with diabetic polyneuropathy: Secondary | ICD-10-CM

## 2018-04-21 DIAGNOSIS — M2012 Hallux valgus (acquired), left foot: Secondary | ICD-10-CM

## 2018-04-21 NOTE — Progress Notes (Signed)
Complaint:  Visit Type: Patient returns to my office for continued preventative foot care services. Complaint: Patient states" my nails have grown long and thick and become painful to walk and wear shoes"  Patient also says his callus have improved. The patient presents for preventative foot care services. No changes to ROS  Podiatric Exam: Vascular: dorsalis pedis and posterior tibial pulses are palpable bilateral. Capillary return is immediate. Temperature gradient is WNL. Skin turgor WNL  Sensorium: Diminished  Semmes Weinstein monofilament test. Normal tactile sensation bilaterally. Nail Exam:  Long thick ingrown toenails both feet. Ulcer Exam: There is no evidence of ulcer or pre-ulcerative changes or infection. Orthopedic Exam: Muscle tone and strength are WNL. No limitations in general ROM. No crepitus or effusions noted. Foot type and digits show no abnormalities. Bony prominences are unremarkable. Skin:  Porokeratosis sub 5 left asymptomatic. Marland Kitchen No infection or ulcers  Diagnosis:  Onychomycosis, , Pain in right toe, pain in left toes  Porokeratosis  Left.  Treatment & Plan Procedures and Treatment: Consent by patient was obtained for treatment procedures.   Debridement of mycotic and hypertrophic toenails, 1 through 5 bilateral and clearing of subungual debris. No ulceration, no infection noted. Debridement of porokeratosis left foot.   Return Visit-Office Procedure: Patient instructed to return to the office for a follow up visit 3 months for continued evaluation and treatment.    Gardiner Barefoot DPM

## 2018-07-21 ENCOUNTER — Encounter: Payer: Self-pay | Admitting: Podiatry

## 2018-07-21 ENCOUNTER — Ambulatory Visit: Payer: Medicare Other | Admitting: Podiatry

## 2018-07-21 DIAGNOSIS — M79675 Pain in left toe(s): Secondary | ICD-10-CM

## 2018-07-21 DIAGNOSIS — Q828 Other specified congenital malformations of skin: Secondary | ICD-10-CM | POA: Diagnosis not present

## 2018-07-21 DIAGNOSIS — M79674 Pain in right toe(s): Secondary | ICD-10-CM | POA: Diagnosis not present

## 2018-07-21 DIAGNOSIS — B351 Tinea unguium: Secondary | ICD-10-CM

## 2018-07-21 DIAGNOSIS — E1142 Type 2 diabetes mellitus with diabetic polyneuropathy: Secondary | ICD-10-CM

## 2018-07-21 NOTE — Progress Notes (Signed)
Complaint:  Visit Type: Patient returns to my office for continued preventative foot care services. Complaint: Patient states" my nails have grown long and thick and become painful to walk and wear shoes"  Patient also says his callus have improved. The patient presents for preventative foot care services. No changes to ROS  Podiatric Exam: Vascular: dorsalis pedis and posterior tibial pulses are palpable bilateral. Capillary return is immediate. Temperature gradient is WNL. Skin turgor WNL  Sensorium: Diminished  Semmes Weinstein monofilament test. Normal tactile sensation bilaterally. Nail Exam:  Long thick ingrown toenails both feet. Ulcer Exam: There is no evidence of ulcer or pre-ulcerative changes or infection. Orthopedic Exam: Muscle tone and strength are WNL. No limitations in general ROM. No crepitus or effusions noted. Foot type and digits show no abnormalities. Bony prominences are unremarkable. Skin:  Porokeratosis sub 5 left ymptomatic. . No infection or ulcers  Diagnosis:  Onychomycosis, , Pain in right toe, pain in left toes  Porokeratosis  Left.  Treatment & Plan Procedures and Treatment: Consent by patient was obtained for treatment procedures.   Debridement of mycotic and hypertrophic toenails, 1 through 5 bilateral and clearing of subungual debris. No ulceration, no infection noted. Debridement of porokeratosis left foot.   Return Visit-Office Procedure: Patient instructed to return to the office for a follow up visit 3 months for continued evaluation and treatment.    Kelin Borum DPM 

## 2018-10-20 ENCOUNTER — Ambulatory Visit: Payer: Medicare Other | Admitting: Podiatry

## 2018-12-14 ENCOUNTER — Ambulatory Visit: Payer: Medicare Other | Admitting: Podiatry

## 2018-12-14 ENCOUNTER — Encounter: Payer: Self-pay | Admitting: Podiatry

## 2018-12-14 ENCOUNTER — Other Ambulatory Visit: Payer: Self-pay

## 2018-12-14 DIAGNOSIS — M79675 Pain in left toe(s): Secondary | ICD-10-CM

## 2018-12-14 DIAGNOSIS — M79674 Pain in right toe(s): Secondary | ICD-10-CM

## 2018-12-14 DIAGNOSIS — E119 Type 2 diabetes mellitus without complications: Secondary | ICD-10-CM | POA: Diagnosis not present

## 2018-12-14 DIAGNOSIS — Q828 Other specified congenital malformations of skin: Secondary | ICD-10-CM

## 2018-12-14 DIAGNOSIS — M216X2 Other acquired deformities of left foot: Secondary | ICD-10-CM | POA: Diagnosis not present

## 2018-12-14 DIAGNOSIS — B351 Tinea unguium: Secondary | ICD-10-CM

## 2018-12-14 NOTE — Progress Notes (Signed)
Complaint:  Visit Type: Patient returns to my office for continued preventative foot care services. Complaint: Patient states" my nails have grown long and thick and become painful to walk and wear shoes"  Patient also says his callus have improved. The patient presents for preventative foot care services. No changes to ROS  Podiatric Exam: Vascular: dorsalis pedis and posterior tibial pulses are palpable bilateral. Capillary return is immediate. Temperature gradient is WNL. Skin turgor WNL  Sensorium: Diminished  Semmes Weinstein monofilament test. Normal tactile sensation bilaterally. Nail Exam:  Long thick ingrown toenails both feet. Ulcer Exam: There is no evidence of ulcer or pre-ulcerative changes or infection. Orthopedic Exam: Muscle tone and strength are WNL. No limitations in general ROM. No crepitus or effusions noted. Foot type and digits show no abnormalities. Bony prominences are unremarkable. Skin:  Porokeratosis sub 5 left ymptomatic. Marland Kitchen No infection or ulcers  Diagnosis:  Onychomycosis, , Pain in right toe, pain in left toes  Porokeratosis  Left.  Treatment & Plan Procedures and Treatment: Consent by patient was obtained for treatment procedures.   Debridement of mycotic and hypertrophic toenails, 1 through 5 bilateral and clearing of subungual debris. No ulceration, no infection noted. Debridement of porokeratosis left foot.   Return Visit-Office Procedure: Patient instructed to return to the office for a follow up visit 3 months for continued evaluation and treatment.    Gardiner Barefoot DPM

## 2019-03-22 ENCOUNTER — Ambulatory Visit (INDEPENDENT_AMBULATORY_CARE_PROVIDER_SITE_OTHER): Payer: Medicare Other | Admitting: Podiatry

## 2019-03-22 ENCOUNTER — Encounter: Payer: Self-pay | Admitting: Podiatry

## 2019-03-22 ENCOUNTER — Other Ambulatory Visit: Payer: Self-pay

## 2019-03-22 DIAGNOSIS — Q828 Other specified congenital malformations of skin: Secondary | ICD-10-CM

## 2019-03-22 DIAGNOSIS — B351 Tinea unguium: Secondary | ICD-10-CM | POA: Diagnosis not present

## 2019-03-22 DIAGNOSIS — M79675 Pain in left toe(s): Secondary | ICD-10-CM

## 2019-03-22 DIAGNOSIS — M79674 Pain in right toe(s): Secondary | ICD-10-CM | POA: Diagnosis not present

## 2019-03-22 DIAGNOSIS — E119 Type 2 diabetes mellitus without complications: Secondary | ICD-10-CM | POA: Diagnosis not present

## 2019-03-22 DIAGNOSIS — M216X2 Other acquired deformities of left foot: Secondary | ICD-10-CM

## 2019-03-22 NOTE — Progress Notes (Signed)
Complaint:  Visit Type: Patient returns to my office for continued preventative foot care services. Complaint: Patient states" my nails have grown long and thick and become painful to walk and wear shoes"  Patient also says his callus have improved. The patient presents for preventative foot care services. No changes to ROS  Podiatric Exam: Vascular: dorsalis pedis and posterior tibial pulses are palpable bilateral. Capillary return is immediate. Temperature gradient is WNL. Skin turgor WNL  Sensorium: Diminished  Semmes Weinstein monofilament test. Normal tactile sensation bilaterally. Nail Exam:  Long thick ingrown toenails both feet. Ulcer Exam: There is no evidence of ulcer or pre-ulcerative changes or infection. Orthopedic Exam: Muscle tone and strength are WNL. No limitations in general ROM. No crepitus or effusions noted. Foot type and digits show no abnormalities. Bony prominences are unremarkable. Skin:  Porokeratosis sub 5 left ymptomatic. . No infection or ulcers  Diagnosis:  Onychomycosis, , Pain in right toe, pain in left toes  Porokeratosis  Left.  Treatment & Plan Procedures and Treatment: Consent by patient was obtained for treatment procedures.   Debridement of mycotic and hypertrophic toenails, 1 through 5 bilateral and clearing of subungual debris. No ulceration, no infection noted. Debridement of porokeratosis left foot.   Return Visit-Office Procedure: Patient instructed to return to the office for a follow up visit 3 months for continued evaluation and treatment.    Lovelle Deitrick DPM 

## 2019-06-21 ENCOUNTER — Ambulatory Visit: Payer: Medicare Other | Admitting: Podiatry

## 2019-07-22 ENCOUNTER — Encounter: Payer: Self-pay | Admitting: Podiatry

## 2019-07-22 ENCOUNTER — Other Ambulatory Visit: Payer: Self-pay

## 2019-07-22 ENCOUNTER — Ambulatory Visit: Payer: Medicare Other | Admitting: Podiatry

## 2019-07-22 DIAGNOSIS — Q828 Other specified congenital malformations of skin: Secondary | ICD-10-CM | POA: Diagnosis not present

## 2019-07-22 DIAGNOSIS — M79674 Pain in right toe(s): Secondary | ICD-10-CM | POA: Diagnosis not present

## 2019-07-22 DIAGNOSIS — B351 Tinea unguium: Secondary | ICD-10-CM | POA: Diagnosis not present

## 2019-07-22 DIAGNOSIS — M79675 Pain in left toe(s): Secondary | ICD-10-CM | POA: Diagnosis not present

## 2019-07-22 DIAGNOSIS — E119 Type 2 diabetes mellitus without complications: Secondary | ICD-10-CM | POA: Diagnosis not present

## 2019-07-22 DIAGNOSIS — M216X2 Other acquired deformities of left foot: Secondary | ICD-10-CM

## 2019-07-22 NOTE — Progress Notes (Signed)
Complaint:  Visit Type: Patient returns to my office for continued preventative foot care services. Complaint: Patient states" my nails have grown long and thick and become painful to walk and wear shoes"  Patient also says his callus have improved. The patient presents for preventative foot care services. No changes to ROS  Podiatric Exam: Vascular: dorsalis pedis and posterior tibial pulses are palpable bilateral. Capillary return is immediate. Temperature gradient is WNL. Skin turgor WNL  Sensorium: Diminished  Semmes Weinstein monofilament test. Normal tactile sensation bilaterally. Nail Exam:  Long thick ingrown toenails both feet. Ulcer Exam: There is no evidence of ulcer or pre-ulcerative changes or infection. Orthopedic Exam: Muscle tone and strength are WNL. No limitations in general ROM. No crepitus or effusions noted. Foot type and digits show no abnormalities. Bony prominences are unremarkable. Skin:  Porokeratosis sub 5 left symptomatic. Marland Kitchen No infection or ulcers.  Diffuse callus right forefoot.  Diagnosis:  Onychomycosis, , Pain in right toe, pain in left toes  Porokeratosis  Left.  Treatment & Plan Procedures and Treatment: Consent by patient was obtained for treatment procedures.   Debridement of mycotic and hypertrophic toenails, 1 through 5 bilateral and clearing of subungual debris. No ulceration, no infection noted. Debridement of porokeratosis left foot.   Return Visit-Office Procedure: Patient instructed to return to the office for a follow up visit 3 months for continued evaluation and treatment.    Gardiner Barefoot DPM

## 2019-10-19 ENCOUNTER — Ambulatory Visit: Payer: Medicare Other | Admitting: Podiatry

## 2019-12-09 ENCOUNTER — Ambulatory Visit: Payer: Medicare Other | Admitting: Podiatry

## 2019-12-09 ENCOUNTER — Other Ambulatory Visit: Payer: Self-pay

## 2019-12-09 ENCOUNTER — Encounter: Payer: Self-pay | Admitting: Podiatry

## 2019-12-09 DIAGNOSIS — M216X2 Other acquired deformities of left foot: Secondary | ICD-10-CM

## 2019-12-09 DIAGNOSIS — Q828 Other specified congenital malformations of skin: Secondary | ICD-10-CM | POA: Diagnosis not present

## 2019-12-09 DIAGNOSIS — M79674 Pain in right toe(s): Secondary | ICD-10-CM | POA: Diagnosis not present

## 2019-12-09 DIAGNOSIS — B351 Tinea unguium: Secondary | ICD-10-CM | POA: Diagnosis not present

## 2019-12-09 DIAGNOSIS — E119 Type 2 diabetes mellitus without complications: Secondary | ICD-10-CM

## 2019-12-09 DIAGNOSIS — M79675 Pain in left toe(s): Secondary | ICD-10-CM

## 2019-12-09 NOTE — Progress Notes (Signed)
Complaint:  Visit Type: Patient returns to my office for continued preventative foot care services. Complaint: Patient states" my nails have grown long and thick and become painful to walk and wear shoes"  Patient also says his callus have improved. The patient presents for preventative foot care services. No changes to ROS  Podiatric Exam: Vascular: dorsalis pedis and posterior tibial pulses are palpable bilateral. Capillary return is immediate. Temperature gradient is WNL. Skin turgor WNL  Sensorium: Diminished  Semmes Weinstein monofilament test. Normal tactile sensation bilaterally. Nail Exam:  Long thick ingrown toenails both feet. Ulcer Exam: There is no evidence of ulcer or pre-ulcerative changes or infection. Orthopedic Exam: Muscle tone and strength are WNL. No limitations in general ROM. No crepitus or effusions noted. Foot type and digits show no abnormalities. Bony prominences are unremarkable. Skin:  Porokeratosis sub 5 left symptomatic. Marland Kitchen No infection or ulcers.   Diagnosis:  Onychomycosis, , Pain in right toe, pain in left toes  Porokeratosis  Left.  Treatment & Plan Procedures and Treatment: Consent by patient was obtained for treatment procedures.   Debridement of mycotic and hypertrophic toenails, 1 through 5 bilateral and clearing of subungual debris with nail nipper followed by dremel tool.. No ulceration, no infection noted. Debridement of porokeratosis left foot using a # 15 blade.  Paperwork for dispersion pad was dispensed.   Return Visit-Office Procedure: Patient instructed to return to the office for a follow up visit 3 months for continued evaluation and treatment.    Gardiner Barefoot DPM

## 2020-03-16 ENCOUNTER — Other Ambulatory Visit: Payer: Self-pay

## 2020-03-16 ENCOUNTER — Encounter: Payer: Self-pay | Admitting: Podiatry

## 2020-03-16 ENCOUNTER — Ambulatory Visit: Payer: Medicare Other | Admitting: Podiatry

## 2020-03-16 DIAGNOSIS — Q828 Other specified congenital malformations of skin: Secondary | ICD-10-CM

## 2020-03-16 DIAGNOSIS — M216X2 Other acquired deformities of left foot: Secondary | ICD-10-CM | POA: Diagnosis not present

## 2020-03-16 DIAGNOSIS — M79674 Pain in right toe(s): Secondary | ICD-10-CM | POA: Diagnosis not present

## 2020-03-16 DIAGNOSIS — E119 Type 2 diabetes mellitus without complications: Secondary | ICD-10-CM | POA: Diagnosis not present

## 2020-03-16 DIAGNOSIS — B351 Tinea unguium: Secondary | ICD-10-CM | POA: Diagnosis not present

## 2020-03-16 DIAGNOSIS — M79675 Pain in left toe(s): Secondary | ICD-10-CM | POA: Diagnosis not present

## 2020-03-16 NOTE — Progress Notes (Signed)
This patient returns to my office for at risk foot care.  This patient requires this care by a professional since this patient will be at risk due to having diabetes.  This patient is unable to cut nails himself since the patient cannot reach his nails.These nails are painful walking and wearing shoes.  This patient presents for at risk foot care today.  General Appearance  Alert, conversant and in no acute stress.  Vascular  Dorsalis pedis and posterior tibial  pulses are palpable  bilaterally.  Capillary return is within normal limits  bilaterally. Temperature is within normal limits  bilaterally.  Neurologic  Senn-Weinstein monofilament wire test diminished   bilaterally. Muscle power within normal limits bilaterally.  Nails Long thick ingrown toenails  B/L. No evidence of bacterial infection or drainage bilaterally.  Orthopedic  No limitations of motion  feet .  No crepitus or effusions noted.  No bony pathology or digital deformities noted.  Skin  normotropic skin  noted bilaterally.  No signs of infections or ulcers noted.   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  Onychomycosis  Pain in right toes  Pain in left toes  Consent was obtained for treatment procedures.   Mechanical debridement of nails 1-5  bilaterally performed with a nail nipper.  Filed with dremel without incident. Debridement of porokeratosis with # 15 blade.   Return office visit     3 months                 Told patient to return for periodic foot care and evaluation due to potential at risk complications.   Tawnya Pujol DPM  

## 2020-06-13 ENCOUNTER — Other Ambulatory Visit: Payer: Self-pay

## 2020-06-13 ENCOUNTER — Ambulatory Visit: Payer: Medicare Other | Admitting: Podiatry

## 2020-06-13 ENCOUNTER — Encounter: Payer: Self-pay | Admitting: Podiatry

## 2020-06-13 DIAGNOSIS — M79674 Pain in right toe(s): Secondary | ICD-10-CM | POA: Diagnosis not present

## 2020-06-13 DIAGNOSIS — B351 Tinea unguium: Secondary | ICD-10-CM | POA: Diagnosis not present

## 2020-06-13 DIAGNOSIS — M79675 Pain in left toe(s): Secondary | ICD-10-CM | POA: Diagnosis not present

## 2020-06-13 DIAGNOSIS — E119 Type 2 diabetes mellitus without complications: Secondary | ICD-10-CM | POA: Diagnosis not present

## 2020-06-13 DIAGNOSIS — M216X2 Other acquired deformities of left foot: Secondary | ICD-10-CM

## 2020-06-13 DIAGNOSIS — Q828 Other specified congenital malformations of skin: Secondary | ICD-10-CM

## 2020-06-13 NOTE — Progress Notes (Signed)
This patient returns to my office for at risk foot care.  This patient requires this care by a professional since this patient will be at risk due to having diabetes.  This patient is unable to cut nails himself since the patient cannot reach his nails.These nails are painful walking and wearing shoes.  This patient presents for at risk foot care today.  General Appearance  Alert, conversant and in no acute stress.  Vascular  Dorsalis pedis and posterior tibial  pulses are palpable  bilaterally.  Capillary return is within normal limits  bilaterally. Temperature is within normal limits  bilaterally.  Neurologic  Senn-Weinstein monofilament wire test diminished   bilaterally. Muscle power within normal limits bilaterally.  Nails Long thick ingrown toenails  B/L. No evidence of bacterial infection or drainage bilaterally.  Orthopedic  No limitations of motion  feet .  No crepitus or effusions noted.  No bony pathology or digital deformities noted.  Skin  normotropic skin  noted bilaterally.  No signs of infections or ulcers noted.   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  Onychomycosis  Pain in right toes  Pain in left toes  Consent was obtained for treatment procedures.   Mechanical debridement of nails 1-5  bilaterally performed with a nail nipper.  Filed with dremel without incident. Debridement of porokeratosis with # 15 blade.   Return office visit     3 months                 Told patient to return for periodic foot care and evaluation due to potential at risk complications.   Gregory Mayer DPM  

## 2020-07-05 DIAGNOSIS — E11319 Type 2 diabetes mellitus with unspecified diabetic retinopathy without macular edema: Secondary | ICD-10-CM | POA: Diagnosis not present

## 2020-07-05 DIAGNOSIS — E7800 Pure hypercholesterolemia, unspecified: Secondary | ICD-10-CM | POA: Diagnosis not present

## 2020-07-05 DIAGNOSIS — I10 Essential (primary) hypertension: Secondary | ICD-10-CM | POA: Diagnosis not present

## 2020-07-05 DIAGNOSIS — N1831 Chronic kidney disease, stage 3a: Secondary | ICD-10-CM | POA: Diagnosis not present

## 2020-09-12 ENCOUNTER — Other Ambulatory Visit: Payer: Self-pay

## 2020-09-12 ENCOUNTER — Encounter: Payer: Self-pay | Admitting: Podiatry

## 2020-09-12 ENCOUNTER — Ambulatory Visit: Payer: Medicare Other | Admitting: Podiatry

## 2020-09-12 DIAGNOSIS — E7800 Pure hypercholesterolemia, unspecified: Secondary | ICD-10-CM | POA: Insufficient documentation

## 2020-09-12 DIAGNOSIS — Q828 Other specified congenital malformations of skin: Secondary | ICD-10-CM

## 2020-09-12 DIAGNOSIS — B351 Tinea unguium: Secondary | ICD-10-CM | POA: Diagnosis not present

## 2020-09-12 DIAGNOSIS — M79675 Pain in left toe(s): Secondary | ICD-10-CM | POA: Diagnosis not present

## 2020-09-12 DIAGNOSIS — N1831 Chronic kidney disease, stage 3a: Secondary | ICD-10-CM | POA: Diagnosis not present

## 2020-09-12 DIAGNOSIS — E538 Deficiency of other specified B group vitamins: Secondary | ICD-10-CM | POA: Insufficient documentation

## 2020-09-12 DIAGNOSIS — M216X2 Other acquired deformities of left foot: Secondary | ICD-10-CM | POA: Diagnosis not present

## 2020-09-12 DIAGNOSIS — E119 Type 2 diabetes mellitus without complications: Secondary | ICD-10-CM

## 2020-09-12 DIAGNOSIS — E11319 Type 2 diabetes mellitus with unspecified diabetic retinopathy without macular edema: Secondary | ICD-10-CM | POA: Insufficient documentation

## 2020-09-12 DIAGNOSIS — M79674 Pain in right toe(s): Secondary | ICD-10-CM

## 2020-09-12 DIAGNOSIS — I10 Essential (primary) hypertension: Secondary | ICD-10-CM | POA: Insufficient documentation

## 2020-09-12 DIAGNOSIS — H9190 Unspecified hearing loss, unspecified ear: Secondary | ICD-10-CM | POA: Insufficient documentation

## 2020-09-12 NOTE — Progress Notes (Signed)
This patient returns to my office for at risk foot care.  This patient requires this care by a professional since this patient will be at risk due to having diabetes.  This patient is unable to cut nails himself since the patient cannot reach his nails.These nails are painful walking and wearing shoes.  This patient presents for at risk foot care today.  General Appearance  Alert, conversant and in no acute stress.  Vascular  Dorsalis pedis and posterior tibial  pulses are palpable  bilaterally.  Capillary return is within normal limits  bilaterally. Temperature is within normal limits  bilaterally.  Neurologic  Senn-Weinstein monofilament wire test diminished   bilaterally. Muscle power within normal limits bilaterally.  Nails Long thick ingrown toenails  B/L. No evidence of bacterial infection or drainage bilaterally.  Orthopedic  No limitations of motion  feet .  No crepitus or effusions noted.  No bony pathology or digital deformities noted.  Skin  normotropic skin  noted bilaterally.  No signs of infections or ulcers noted.   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  Porokeratosis sub 4th right foot  Onychomycosis  Pain in right toes  Pain in left toes  Consent was obtained for treatment procedures.   Mechanical debridement of nails 1-5  bilaterally performed with a nail nipper.  Filed with dremel without incident. Debridement of porokeratosis with # 15 blade.   Return office visit     3 months                 Told patient to return for periodic foot care and evaluation due to potential at risk complications.   Gardiner Barefoot DPM

## 2020-09-19 DIAGNOSIS — H26493 Other secondary cataract, bilateral: Secondary | ICD-10-CM | POA: Diagnosis not present

## 2020-09-19 DIAGNOSIS — H40013 Open angle with borderline findings, low risk, bilateral: Secondary | ICD-10-CM | POA: Diagnosis not present

## 2020-11-21 DIAGNOSIS — I10 Essential (primary) hypertension: Secondary | ICD-10-CM | POA: Diagnosis not present

## 2020-11-21 DIAGNOSIS — E11319 Type 2 diabetes mellitus with unspecified diabetic retinopathy without macular edema: Secondary | ICD-10-CM | POA: Diagnosis not present

## 2020-11-21 DIAGNOSIS — E7800 Pure hypercholesterolemia, unspecified: Secondary | ICD-10-CM | POA: Diagnosis not present

## 2020-11-21 DIAGNOSIS — N1831 Chronic kidney disease, stage 3a: Secondary | ICD-10-CM | POA: Diagnosis not present

## 2020-12-19 ENCOUNTER — Other Ambulatory Visit: Payer: Self-pay

## 2020-12-19 ENCOUNTER — Encounter: Payer: Self-pay | Admitting: Podiatry

## 2020-12-19 ENCOUNTER — Ambulatory Visit: Payer: Medicare Other | Admitting: Podiatry

## 2020-12-19 DIAGNOSIS — M79674 Pain in right toe(s): Secondary | ICD-10-CM | POA: Diagnosis not present

## 2020-12-19 DIAGNOSIS — B351 Tinea unguium: Secondary | ICD-10-CM

## 2020-12-19 DIAGNOSIS — Q828 Other specified congenital malformations of skin: Secondary | ICD-10-CM

## 2020-12-19 DIAGNOSIS — M216X2 Other acquired deformities of left foot: Secondary | ICD-10-CM

## 2020-12-19 DIAGNOSIS — N1831 Chronic kidney disease, stage 3a: Secondary | ICD-10-CM

## 2020-12-19 DIAGNOSIS — M79675 Pain in left toe(s): Secondary | ICD-10-CM | POA: Diagnosis not present

## 2020-12-19 DIAGNOSIS — E119 Type 2 diabetes mellitus without complications: Secondary | ICD-10-CM

## 2020-12-19 NOTE — Progress Notes (Signed)
This patient returns to my office for at risk foot care.  This patient requires this care by a professional since this patient will be at risk due to having diabetes.  This patient is unable to cut nails himself since the patient cannot reach his nails.These nails are painful walking and wearing shoes.  This patient presents for at risk foot care today.  General Appearance  Alert, conversant and in no acute stress.  Vascular  Dorsalis pedis and posterior tibial  pulses are palpable  bilaterally.  Capillary return is within normal limits  bilaterally. Temperature is within normal limits  bilaterally.  Neurologic  Senn-Weinstein monofilament wire test diminished   bilaterally. Muscle power within normal limits bilaterally.  Nails Long thick ingrown toenails  B/L. No evidence of bacterial infection or drainage bilaterally.  Orthopedic  No limitations of motion  feet .  No crepitus or effusions noted.  No bony pathology or digital deformities noted.  Skin  normotropic skin  noted bilaterally.  No signs of infections or ulcers noted.   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    Onychomycosis  Pain in right toes  Pain in left toes  Porokeratosis sub 5th left foot.  Consent was obtained for treatment procedures.   Mechanical debridement of nails 1-5  bilaterally performed with a nail nipper.  Filed with dremel without incident. Debridement of porokeratosis with # 15 blade.   Return office visit     3 months                 Told patient to return for periodic foot care and evaluation due to potential at risk complications.   Antoniette Peake DPM  

## 2020-12-31 DIAGNOSIS — E7800 Pure hypercholesterolemia, unspecified: Secondary | ICD-10-CM | POA: Diagnosis not present

## 2020-12-31 DIAGNOSIS — I10 Essential (primary) hypertension: Secondary | ICD-10-CM | POA: Diagnosis not present

## 2020-12-31 DIAGNOSIS — E538 Deficiency of other specified B group vitamins: Secondary | ICD-10-CM | POA: Diagnosis not present

## 2020-12-31 DIAGNOSIS — E11319 Type 2 diabetes mellitus with unspecified diabetic retinopathy without macular edema: Secondary | ICD-10-CM | POA: Diagnosis not present

## 2020-12-31 DIAGNOSIS — Z0000 Encounter for general adult medical examination without abnormal findings: Secondary | ICD-10-CM | POA: Diagnosis not present

## 2021-01-01 ENCOUNTER — Other Ambulatory Visit: Payer: Self-pay

## 2021-01-01 ENCOUNTER — Emergency Department (HOSPITAL_BASED_OUTPATIENT_CLINIC_OR_DEPARTMENT_OTHER): Payer: Medicare Other

## 2021-01-01 ENCOUNTER — Encounter (HOSPITAL_BASED_OUTPATIENT_CLINIC_OR_DEPARTMENT_OTHER): Payer: Self-pay | Admitting: *Deleted

## 2021-01-01 ENCOUNTER — Emergency Department (HOSPITAL_BASED_OUTPATIENT_CLINIC_OR_DEPARTMENT_OTHER)
Admission: EM | Admit: 2021-01-01 | Discharge: 2021-01-01 | Disposition: A | Discharge status: Home / Self Care | Payer: Medicare Other | Attending: Emergency Medicine | Admitting: Emergency Medicine

## 2021-01-01 DIAGNOSIS — J9811 Atelectasis: Secondary | ICD-10-CM | POA: Diagnosis not present

## 2021-01-01 DIAGNOSIS — Z79899 Other long term (current) drug therapy: Secondary | ICD-10-CM | POA: Insufficient documentation

## 2021-01-01 DIAGNOSIS — S2232XA Fracture of one rib, left side, initial encounter for closed fracture: Secondary | ICD-10-CM | POA: Insufficient documentation

## 2021-01-01 DIAGNOSIS — S62522A Displaced fracture of distal phalanx of left thumb, initial encounter for closed fracture: Secondary | ICD-10-CM | POA: Diagnosis not present

## 2021-01-01 DIAGNOSIS — S62525A Nondisplaced fracture of distal phalanx of left thumb, initial encounter for closed fracture: Secondary | ICD-10-CM | POA: Diagnosis not present

## 2021-01-01 DIAGNOSIS — S62502A Fracture of unspecified phalanx of left thumb, initial encounter for closed fracture: Secondary | ICD-10-CM

## 2021-01-01 DIAGNOSIS — N1831 Chronic kidney disease, stage 3a: Secondary | ICD-10-CM | POA: Diagnosis not present

## 2021-01-01 DIAGNOSIS — Z794 Long term (current) use of insulin: Secondary | ICD-10-CM | POA: Diagnosis not present

## 2021-01-01 DIAGNOSIS — Z87891 Personal history of nicotine dependence: Secondary | ICD-10-CM | POA: Insufficient documentation

## 2021-01-01 DIAGNOSIS — Z7984 Long term (current) use of oral hypoglycemic drugs: Secondary | ICD-10-CM | POA: Diagnosis not present

## 2021-01-01 DIAGNOSIS — W182XXA Fall in (into) shower or empty bathtub, initial encounter: Secondary | ICD-10-CM | POA: Insufficient documentation

## 2021-01-01 DIAGNOSIS — I129 Hypertensive chronic kidney disease with stage 1 through stage 4 chronic kidney disease, or unspecified chronic kidney disease: Secondary | ICD-10-CM | POA: Insufficient documentation

## 2021-01-01 DIAGNOSIS — J90 Pleural effusion, not elsewhere classified: Secondary | ICD-10-CM | POA: Diagnosis not present

## 2021-01-01 DIAGNOSIS — S301XXA Contusion of abdominal wall, initial encounter: Secondary | ICD-10-CM | POA: Insufficient documentation

## 2021-01-01 DIAGNOSIS — E1122 Type 2 diabetes mellitus with diabetic chronic kidney disease: Secondary | ICD-10-CM | POA: Insufficient documentation

## 2021-01-01 DIAGNOSIS — M954 Acquired deformity of chest and rib: Secondary | ICD-10-CM | POA: Diagnosis not present

## 2021-01-01 DIAGNOSIS — S6992XA Unspecified injury of left wrist, hand and finger(s), initial encounter: Secondary | ICD-10-CM | POA: Diagnosis present

## 2021-01-01 DIAGNOSIS — W19XXXA Unspecified fall, initial encounter: Secondary | ICD-10-CM

## 2021-01-01 NOTE — ED Provider Notes (Signed)
[facility] EMERGENCY DEPARTMENT Provider Note   CSN: [id number] Arrival date & time: 01/01/21  1435     History Chief Complaint  Patient presents with   Carlos Leach    DONIELLE RADZIEWICZ is a 80 y.o. male w/ hx of HTN presenting to ED with fall 4 days ago, slipped in shower, struck his left ribs on the edge of the tub.  He has had pain since then.  Worse with deep inspiration.  Nothing makes it better except staying still.  Denies head injury, LOC, A/C use.  Reports soreness and bruising in his left thumb as well.  HPI     Past Medical History:  Diagnosis Date   Cataract    Diabetes mellitus without complication ([place])    Hypertension     Patient Active Problem List   Diagnosis Date Noted   Chronic kidney disease, stage 3a ([place]) 09/12/2020   Diabetic retinopathy associated with type 2 diabetes mellitus ([place]) 09/12/2020   Essential hypertension 09/12/2020   Hearing loss 09/12/2020   High cholesterol 09/12/2020   Vitamin B12 deficiency (non anemic) 09/12/2020   Pain due to onychomycosis of toenails of both feet 12/14/2018   Porokeratosis 12/14/2018   Plantar flexed metatarsal bone of left foot 12/14/2018   Diabetes mellitus without complication ([place]) 95/18/8416    Past Surgical History:  Procedure Laterality Date   APPENDECTOMY     EYE SURGERY         Family History  Problem Relation Age of Onset   Heart disease Father     Social History   Tobacco Use   Smoking status: Former    Pack years: 0.00   Smokeless tobacco: Never  Vaping Use   Vaping Use: Never used  Substance Use Topics   Alcohol use: Yes   Drug use: No    Home Medications Prior to Admission medications   Medication Sig Start Date End Date Taking? Authorizing Provider  B-Complex TABS 1 tablet 04/16/16   [provider]  Blood Glucose Monitoring Suppl (ONE TOUCH ULTRA 2) w/Device KIT use to test your blood sugar once a day 06/29/13   [provider]  cholecalciferol (VITAMIN  D) 25 MCG (1000 UNIT) tablet 1 tablet    [provider]  [employer] LANCETS MICRO THIN 33G MISC USE TO TEST BLOOD SUGAR ONCE A DAY AS DIRECTED 04/04/18   [provider]  famotidine-calcium carbonate-magnesium hydroxide (PEPCID COMPLETE) 10-800-165 MG chewable tablet 1 tablet as needed    [provider]  glimepiride (AMARYL) 4 MG tablet  11/30/18   [provider]  glucose blood test strip  07/01/18   [provider]  hydrochlorothiazide (HYDRODIURIL) 25 MG tablet Take 25 mg by mouth daily. 1/2 tab daily    [provider]  JARDIANCE 25 MG TABS tablet  11/30/18   [provider]  Liraglutide (VICTOZA) 18 MG/3ML SOLN injection Inject 0.6 mg into the skin daily.    [provider]  lisinopril (PRINIVIL,ZESTRIL) 20 MG tablet  04/23/15   [provider]  metFORMIN (GLUCOPHAGE) 500 MG tablet Take 500 mg by mouth 5 (five) times daily.    [provider]  NOVOFINE 32G X 6 MM MISC  04/23/15   [provider]  Plant Sterols and Stanols (CHOLESTOFF) 450 MG TABS See admin instructions.    [provider]  simvastatin (ZOCOR) 40 MG tablet  11/30/18   [provider]  TRULICITY 1.5 SA/6.3KZ SOPN  11/30/18   [provider]  UNABLE TO FIND  11/30/18   [provider]  [place] 18 MG/3ML SOPN  04/23/15   [provider]  Zinc 50 MG TABS 1 tablet    [provider]    Allergies    Patient has no known allergies.  Review of Systems   Review of Systems  Constitutional:  Negative for chills and fever.  Respiratory:  Negative for cough and shortness of breath.   Cardiovascular:  Positive for chest pain. Negative for palpitations.  Gastrointestinal:  Negative for abdominal pain and vomiting.  Musculoskeletal:  Positive for arthralgias and myalgias.  Skin:  Negative for color change and rash.  Neurological:  Negative for syncope, light-headedness and headaches.  All other  systems reviewed and are negative.  Physical Exam Updated Vital Signs BP (!) 150/77 (BP Location: Right Arm)   Pulse 74   Temp 98.3 F (36.8 C) (Oral)   Resp 20   Ht 6' (1.829 m)   Wt 81.6 kg   SpO2 98%   BMI 24.41 kg/m   Physical Exam Constitutional:      General: He is not in acute distress. HENT:     Head: Normocephalic and atraumatic.  Eyes:     Conjunctiva/sclera: Conjunctivae normal.     Pupils: Pupils are equal, round, and reactive to light.  Cardiovascular:     Rate and Rhythm: Normal rate and regular rhythm.  Pulmonary:     Effort: Pulmonary effort is normal. No respiratory distress.     Breath sounds: Normal breath sounds.  Abdominal:     Tenderness: There is no abdominal tenderness.  Musculoskeletal:     Comments: Ecchymosis left thumb, no significant bony tenderness Bruising left flank, ttp of left ribline NO spinal midline tenderness  Skin:    General: Skin is warm and dry.  Neurological:     General: No focal deficit present.     Mental Status: He is alert. Mental status is at baseline.  Psychiatric:        Mood and Affect: Mood normal.        Behavior: Behavior normal.    ED Results / Procedures / Treatments   Labs (all labs ordered are listed, but only abnormal results are displayed) Labs Reviewed - No data to display  EKG None  Radiology DG Ribs Unilateral W/Chest Left  Result Date: 01/01/2021 CLINICAL DATA:  [place] this weekend, LEFT rib pain EXAM: LEFT RIBS AND CHEST - 3+ VIEW COMPARISON:  None FINDINGS: Normal heart size, mediastinal contours, and pulmonary vascularity. Minimal LEFT pleural effusion and basilar atelectasis. Remaining lungs clear. No pneumothorax or acute infiltrate. Bones demineralized. BB placed at site of symptoms lower LEFT ribs. Scattered costal cartilaginous calcifications. Deformities of several lower anterolateral LEFT ribs identified,, some of which appear old but suspect an acute fracture of the LEFT seventh rib.  IMPRESSION: Probable acute fracture of the anterolateral LEFT seventh rib with additional age-indeterminate fractures of additional lower LEFT ribs. Minimal LEFT pleural effusion and basilar atelectasis. Osseous demineralization. Electronically Signed   By: Lavonia Dana M.D.   On: 01/01/2021 15:55   DG Finger Thumb Left  Result Date: 01/01/2021 CLINICAL DATA:  [place] this weekend, LEFT thumb pain EXAM: LEFT THUMB 2+V COMPARISON:  None FINDINGS: Osseous mineralization normal. Mild degenerative changes first MCP joint. Nondisplaced volar plate avulsion fracture. No additional fracture, dislocation, or bone destruction. IMPRESSION: Nondisplaced volar plate avulsion fracture, base of distal phalanx LEFT thumb. Electronically Signed   By: Crist Infante.D.  On: 01/01/2021 15:51    Procedures Procedures   Medications Ordered in ED Medications - No data to display  ED Course  I have reviewed the triage vital signs and the nursing notes.  Pertinent labs & imaging results that were available during my care of the patient were reviewed by me and considered in my medical decision making (see chart for details).  Fall, left rib pain, suspected fx Doubt PTX clinically - will check xray films No sign of flail chest Doubt spinal fx clinically No head injury - not on a/c Left thumb bruised - will xray as well  Pain under control  *  Xray reviewed - left rib fx, left thumb avulsion fx Incentive spirometer given, f/u with PCP, both fractures likely to heal on their own Pain under control   Final Clinical Impression(s) / ED Diagnoses Final diagnoses:  Fall, initial encounter  Closed fracture of one rib of left side, initial encounter  Closed avulsion fracture of phalanx of left thumb, initial encounter    Rx / DC Orders ED Discharge Orders     None        Wyvonnia Dusky, MD 01/02/21 [phone]

## 2021-01-01 NOTE — ED Triage Notes (Signed)
He slipped in the shower and fell 3 days ago. Pain in his left ribs.

## 2021-01-01 NOTE — Discharge Instructions (Addendum)
Your x-ray showed a likely fracture of your left 7th rib.  This should heal on its own in 4 to 6 weeks.  You should use incentive spirometer at home, 10 breaths at a time, 10 times a day, for the next 2 weeks.  This is to help prevent pneumonia from developing.  You can also take ibuprofen and Tylenol as needed for pain for the next 2 weeks.  Try to take this medicine with food whenever possible.  You should call to make a follow up appointment with your primary provider in 1 week.  Your x-rays also showed a small fracture near the end of your left thumb.  This should heal on its own in 4 to 6 weeks.  You can wear the finger splint as needed for comfort for the next 2 to 3 weeks.  You can take this off when you are bathing.

## 2021-01-07 DIAGNOSIS — I10 Essential (primary) hypertension: Secondary | ICD-10-CM | POA: Diagnosis not present

## 2021-01-07 DIAGNOSIS — E11319 Type 2 diabetes mellitus with unspecified diabetic retinopathy without macular edema: Secondary | ICD-10-CM | POA: Diagnosis not present

## 2021-01-07 DIAGNOSIS — E538 Deficiency of other specified B group vitamins: Secondary | ICD-10-CM | POA: Diagnosis not present

## 2021-01-07 DIAGNOSIS — E7800 Pure hypercholesterolemia, unspecified: Secondary | ICD-10-CM | POA: Diagnosis not present

## 2021-01-07 DIAGNOSIS — Z0000 Encounter for general adult medical examination without abnormal findings: Secondary | ICD-10-CM | POA: Diagnosis not present

## 2021-01-29 DIAGNOSIS — D692 Other nonthrombocytopenic purpura: Secondary | ICD-10-CM | POA: Diagnosis not present

## 2021-01-29 DIAGNOSIS — E11319 Type 2 diabetes mellitus with unspecified diabetic retinopathy without macular edema: Secondary | ICD-10-CM | POA: Diagnosis not present

## 2021-01-29 DIAGNOSIS — D2371 Other benign neoplasm of skin of right lower limb, including hip: Secondary | ICD-10-CM | POA: Diagnosis not present

## 2021-01-29 DIAGNOSIS — D2239 Melanocytic nevi of other parts of face: Secondary | ICD-10-CM | POA: Diagnosis not present

## 2021-01-29 DIAGNOSIS — L570 Actinic keratosis: Secondary | ICD-10-CM | POA: Diagnosis not present

## 2021-01-29 DIAGNOSIS — N1831 Chronic kidney disease, stage 3a: Secondary | ICD-10-CM | POA: Diagnosis not present

## 2021-01-29 DIAGNOSIS — I10 Essential (primary) hypertension: Secondary | ICD-10-CM | POA: Diagnosis not present

## 2021-01-29 DIAGNOSIS — E7800 Pure hypercholesterolemia, unspecified: Secondary | ICD-10-CM | POA: Diagnosis not present

## 2021-01-29 DIAGNOSIS — L814 Other melanin hyperpigmentation: Secondary | ICD-10-CM | POA: Diagnosis not present

## 2021-01-29 DIAGNOSIS — L821 Other seborrheic keratosis: Secondary | ICD-10-CM | POA: Diagnosis not present

## 2021-01-29 DIAGNOSIS — D1801 Hemangioma of skin and subcutaneous tissue: Secondary | ICD-10-CM | POA: Diagnosis not present

## 2021-01-29 DIAGNOSIS — Z85828 Personal history of other malignant neoplasm of skin: Secondary | ICD-10-CM | POA: Diagnosis not present

## 2021-03-13 DIAGNOSIS — I10 Essential (primary) hypertension: Secondary | ICD-10-CM | POA: Diagnosis not present

## 2021-03-13 DIAGNOSIS — E7800 Pure hypercholesterolemia, unspecified: Secondary | ICD-10-CM | POA: Diagnosis not present

## 2021-03-13 DIAGNOSIS — N1831 Chronic kidney disease, stage 3a: Secondary | ICD-10-CM | POA: Diagnosis not present

## 2021-03-13 DIAGNOSIS — E11319 Type 2 diabetes mellitus with unspecified diabetic retinopathy without macular edema: Secondary | ICD-10-CM | POA: Diagnosis not present

## 2021-03-25 ENCOUNTER — Ambulatory Visit: Payer: Medicare Other | Admitting: Podiatry

## 2021-03-25 DIAGNOSIS — H524 Presbyopia: Secondary | ICD-10-CM | POA: Diagnosis not present

## 2021-03-25 DIAGNOSIS — H40013 Open angle with borderline findings, low risk, bilateral: Secondary | ICD-10-CM | POA: Diagnosis not present

## 2021-03-25 DIAGNOSIS — H26493 Other secondary cataract, bilateral: Secondary | ICD-10-CM | POA: Diagnosis not present

## 2021-03-25 DIAGNOSIS — E113292 Type 2 diabetes mellitus with mild nonproliferative diabetic retinopathy without macular edema, left eye: Secondary | ICD-10-CM | POA: Diagnosis not present

## 2021-03-26 ENCOUNTER — Encounter: Payer: Self-pay | Admitting: Podiatry

## 2021-03-26 ENCOUNTER — Other Ambulatory Visit: Payer: Self-pay

## 2021-03-26 ENCOUNTER — Ambulatory Visit: Payer: Medicare Other | Admitting: Podiatry

## 2021-03-26 DIAGNOSIS — M216X2 Other acquired deformities of left foot: Secondary | ICD-10-CM

## 2021-03-26 DIAGNOSIS — M79674 Pain in right toe(s): Secondary | ICD-10-CM

## 2021-03-26 DIAGNOSIS — B351 Tinea unguium: Secondary | ICD-10-CM

## 2021-03-26 DIAGNOSIS — Q828 Other specified congenital malformations of skin: Secondary | ICD-10-CM | POA: Diagnosis not present

## 2021-03-26 DIAGNOSIS — N1831 Chronic kidney disease, stage 3a: Secondary | ICD-10-CM | POA: Diagnosis not present

## 2021-03-26 DIAGNOSIS — E119 Type 2 diabetes mellitus without complications: Secondary | ICD-10-CM | POA: Diagnosis not present

## 2021-03-26 DIAGNOSIS — M79675 Pain in left toe(s): Secondary | ICD-10-CM | POA: Diagnosis not present

## 2021-03-26 NOTE — Progress Notes (Signed)
This patient returns to my office for at risk foot care.  This patient requires this care by a professional since this patient will be at risk due to having diabetes.  This patient is unable to cut nails himself since the patient cannot reach his nails.These nails are painful walking and wearing shoes.  This patient presents for at risk foot care today.  General Appearance  Alert, conversant and in no acute stress.  Vascular  Dorsalis pedis and posterior tibial  pulses are palpable  bilaterally.  Capillary return is within normal limits  bilaterally. Temperature is within normal limits  bilaterally.  Neurologic  Senn-Weinstein monofilament wire test diminished   bilaterally. Muscle power within normal limits bilaterally.  Nails Long thick ingrown toenails  B/L. No evidence of bacterial infection or drainage bilaterally.  Orthopedic  No limitations of motion  feet .  No crepitus or effusions noted.  No bony pathology or digital deformities noted.  Skin  normotropic skin  noted bilaterally.  No signs of infections or ulcers noted.   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    Onychomycosis  Pain in right toes  Pain in left toes  Porokeratosis sub 5th left foot.  Consent was obtained for treatment procedures.   Mechanical debridement of nails 1-5  bilaterally performed with a nail nipper.  Filed with dremel without incident. Debridement of porokeratosis with # 15 blade.   Return office visit     3 months                 Told patient to return for periodic foot care and evaluation due to potential at risk complications.   Gregory Mayer DPM  

## 2021-04-30 DIAGNOSIS — H26491 Other secondary cataract, right eye: Secondary | ICD-10-CM | POA: Diagnosis not present

## 2021-06-26 ENCOUNTER — Other Ambulatory Visit: Payer: Self-pay

## 2021-06-26 ENCOUNTER — Encounter: Payer: Self-pay | Admitting: Podiatry

## 2021-06-26 ENCOUNTER — Ambulatory Visit: Payer: Medicare Other | Admitting: Podiatry

## 2021-06-26 DIAGNOSIS — M79675 Pain in left toe(s): Secondary | ICD-10-CM | POA: Diagnosis not present

## 2021-06-26 DIAGNOSIS — M216X2 Other acquired deformities of left foot: Secondary | ICD-10-CM

## 2021-06-26 DIAGNOSIS — E119 Type 2 diabetes mellitus without complications: Secondary | ICD-10-CM | POA: Diagnosis not present

## 2021-06-26 DIAGNOSIS — B351 Tinea unguium: Secondary | ICD-10-CM | POA: Diagnosis not present

## 2021-06-26 DIAGNOSIS — M79674 Pain in right toe(s): Secondary | ICD-10-CM

## 2021-06-26 DIAGNOSIS — Q828 Other specified congenital malformations of skin: Secondary | ICD-10-CM | POA: Diagnosis not present

## 2021-06-26 DIAGNOSIS — N1831 Chronic kidney disease, stage 3a: Secondary | ICD-10-CM | POA: Diagnosis not present

## 2021-06-26 NOTE — Progress Notes (Signed)
This patient returns to my office for at risk foot care.  This patient requires this care by a professional since this patient will be at risk due to having diabetes.  This patient is unable to cut nails himself since the patient cannot reach his nails.These nails are painful walking and wearing shoes.  This patient presents for at risk foot care today.  General Appearance  Alert, conversant and in no acute stress.  Vascular  Dorsalis pedis and posterior tibial  pulses are palpable  bilaterally.  Capillary return is within normal limits  bilaterally. Temperature is within normal limits  bilaterally.  Neurologic  Senn-Weinstein monofilament wire test diminished   bilaterally. Muscle power within normal limits bilaterally.  Nails Long thick ingrown toenails  B/L. No evidence of bacterial infection or drainage bilaterally.  Orthopedic  No limitations of motion  feet .  No crepitus or effusions noted.  No bony pathology or digital deformities noted.  Skin  normotropic skin  noted bilaterally.  No signs of infections or ulcers noted.   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    Onychomycosis  Pain in right toes  Pain in left toes  Porokeratosis sub 5th left foot.  Consent was obtained for treatment procedures.   Mechanical debridement of nails 1-5  bilaterally performed with a nail nipper.  Filed with dremel without incident. Debridement of porokeratosis with # 15 blade.   Return office visit     3 months                 Told patient to return for periodic foot care and evaluation due to potential at risk complications.   Constantin Hillery DPM  

## 2021-07-10 DIAGNOSIS — E11319 Type 2 diabetes mellitus with unspecified diabetic retinopathy without macular edema: Secondary | ICD-10-CM | POA: Diagnosis not present

## 2021-07-10 DIAGNOSIS — E538 Deficiency of other specified B group vitamins: Secondary | ICD-10-CM | POA: Diagnosis not present

## 2021-07-10 DIAGNOSIS — I10 Essential (primary) hypertension: Secondary | ICD-10-CM | POA: Diagnosis not present

## 2021-09-23 DIAGNOSIS — N1831 Chronic kidney disease, stage 3a: Secondary | ICD-10-CM | POA: Diagnosis not present

## 2021-09-23 DIAGNOSIS — E538 Deficiency of other specified B group vitamins: Secondary | ICD-10-CM | POA: Diagnosis not present

## 2021-09-23 DIAGNOSIS — E7800 Pure hypercholesterolemia, unspecified: Secondary | ICD-10-CM | POA: Diagnosis not present

## 2021-09-23 DIAGNOSIS — I10 Essential (primary) hypertension: Secondary | ICD-10-CM | POA: Diagnosis not present

## 2021-09-23 DIAGNOSIS — E113292 Type 2 diabetes mellitus with mild nonproliferative diabetic retinopathy without macular edema, left eye: Secondary | ICD-10-CM | POA: Diagnosis not present

## 2021-09-25 ENCOUNTER — Ambulatory Visit: Payer: Medicare Other | Admitting: Podiatry

## 2021-09-25 ENCOUNTER — Encounter: Payer: Self-pay | Admitting: Podiatry

## 2021-09-25 DIAGNOSIS — E119 Type 2 diabetes mellitus without complications: Secondary | ICD-10-CM

## 2021-09-25 DIAGNOSIS — N1831 Chronic kidney disease, stage 3a: Secondary | ICD-10-CM

## 2021-09-25 DIAGNOSIS — M79674 Pain in right toe(s): Secondary | ICD-10-CM

## 2021-09-25 DIAGNOSIS — M216X2 Other acquired deformities of left foot: Secondary | ICD-10-CM

## 2021-09-25 DIAGNOSIS — Q828 Other specified congenital malformations of skin: Secondary | ICD-10-CM | POA: Diagnosis not present

## 2021-09-25 DIAGNOSIS — B351 Tinea unguium: Secondary | ICD-10-CM

## 2021-09-25 DIAGNOSIS — M79675 Pain in left toe(s): Secondary | ICD-10-CM | POA: Diagnosis not present

## 2021-09-25 NOTE — Progress Notes (Signed)
This patient returns to my office for at risk foot care.  This patient requires this care by a professional since this patient will be at risk due to having diabetes.  This patient is unable to cut nails himself since the patient cannot reach his nails.These nails are painful walking and wearing shoes.  This patient presents for at risk foot care today.  General Appearance  Alert, conversant and in no acute stress.  Vascular  Dorsalis pedis and posterior tibial  pulses are palpable  bilaterally.  Capillary return is within normal limits  bilaterally. Temperature is within normal limits  bilaterally.  Neurologic  Senn-Weinstein monofilament wire test diminished   bilaterally. Muscle power within normal limits bilaterally.  Nails Long thick ingrown toenails  B/L. No evidence of bacterial infection or drainage bilaterally.  Orthopedic  No limitations of motion  feet .  No crepitus or effusions noted.  No bony pathology or digital deformities noted.  Skin  normotropic skin  noted bilaterally.  No signs of infections or ulcers noted.   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    Onychomycosis  Pain in right toes  Pain in left toes  Porokeratosis sub 5th left foot.  Consent was obtained for treatment procedures.   Mechanical debridement of nails 1-5  bilaterally performed with a nail nipper.  Filed with dremel without incident. Debridement of porokeratosis with # 15 blade.   Return office visit     3 months                 Told patient to return for periodic foot care and evaluation due to potential at risk complications.   Anterrio Mccleery DPM  

## 2021-11-19 DIAGNOSIS — H40013 Open angle with borderline findings, low risk, bilateral: Secondary | ICD-10-CM | POA: Diagnosis not present

## 2021-12-10 DIAGNOSIS — E7800 Pure hypercholesterolemia, unspecified: Secondary | ICD-10-CM | POA: Diagnosis not present

## 2021-12-10 DIAGNOSIS — N1831 Chronic kidney disease, stage 3a: Secondary | ICD-10-CM | POA: Diagnosis not present

## 2021-12-10 DIAGNOSIS — I10 Essential (primary) hypertension: Secondary | ICD-10-CM | POA: Diagnosis not present

## 2021-12-31 ENCOUNTER — Encounter: Payer: Self-pay | Admitting: Podiatry

## 2021-12-31 ENCOUNTER — Ambulatory Visit: Payer: Medicare Other | Admitting: Podiatry

## 2021-12-31 DIAGNOSIS — N1831 Chronic kidney disease, stage 3a: Secondary | ICD-10-CM

## 2021-12-31 DIAGNOSIS — E119 Type 2 diabetes mellitus without complications: Secondary | ICD-10-CM | POA: Diagnosis not present

## 2021-12-31 DIAGNOSIS — M216X2 Other acquired deformities of left foot: Secondary | ICD-10-CM

## 2021-12-31 DIAGNOSIS — M2011 Hallux valgus (acquired), right foot: Secondary | ICD-10-CM

## 2021-12-31 DIAGNOSIS — M79674 Pain in right toe(s): Secondary | ICD-10-CM | POA: Diagnosis not present

## 2021-12-31 DIAGNOSIS — Q828 Other specified congenital malformations of skin: Secondary | ICD-10-CM | POA: Diagnosis not present

## 2021-12-31 DIAGNOSIS — M79675 Pain in left toe(s): Secondary | ICD-10-CM

## 2021-12-31 DIAGNOSIS — B351 Tinea unguium: Secondary | ICD-10-CM | POA: Diagnosis not present

## 2021-12-31 NOTE — Progress Notes (Signed)
This patient returns to my office for at risk foot care.  This patient requires this care by a professional since this patient will be at risk due to having diabetes.  This patient is unable to cut nails himself since the patient cannot reach his nails.These nails are painful walking and wearing shoes.  This patient presents for at risk foot care today.  General Appearance  Alert, conversant and in no acute stress.  Vascular  Dorsalis pedis and posterior tibial  pulses are palpable  bilaterally.  Capillary return is within normal limits  bilaterally. Temperature is within normal limits  bilaterally.  Neurologic  Senn-Weinstein monofilament wire test diminished   bilaterally. Muscle power within normal limits bilaterally.  Nails Long thick ingrown toenails  B/L. No evidence of bacterial infection or drainage bilaterally.  Orthopedic  No limitations of motion  feet .  No crepitus or effusions noted.  No bony pathology or digital deformities noted.  Skin  normotropic skin  noted bilaterally.  No signs of infections or ulcers noted.   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    Onychomycosis  Pain in right toes  Pain in left toes  Porokeratosis sub 5th left foot.  Consent was obtained for treatment procedures.   Mechanical debridement of nails 1-5  bilaterally performed with a nail nipper.  Filed with dremel without incident. Debridement of porokeratosis with # 15 blade.  Patient to come back for diabetic shoes.   Return office visit     3 months                 Told patient to return for periodic foot care and evaluation due to potential at risk complications.   Gardiner Barefoot DPM

## 2022-01-06 DIAGNOSIS — E538 Deficiency of other specified B group vitamins: Secondary | ICD-10-CM | POA: Diagnosis not present

## 2022-01-06 DIAGNOSIS — I10 Essential (primary) hypertension: Secondary | ICD-10-CM | POA: Diagnosis not present

## 2022-01-06 DIAGNOSIS — E11319 Type 2 diabetes mellitus with unspecified diabetic retinopathy without macular edema: Secondary | ICD-10-CM | POA: Diagnosis not present

## 2022-01-06 DIAGNOSIS — E7800 Pure hypercholesterolemia, unspecified: Secondary | ICD-10-CM | POA: Diagnosis not present

## 2022-01-07 ENCOUNTER — Ambulatory Visit (INDEPENDENT_AMBULATORY_CARE_PROVIDER_SITE_OTHER): Payer: Medicare Other

## 2022-01-07 DIAGNOSIS — E119 Type 2 diabetes mellitus without complications: Secondary | ICD-10-CM

## 2022-01-07 NOTE — Progress Notes (Signed)
Patient presents to the office today for diabetic shoe and insole measuring.  Patient was measured with brannock device to determine size and width for 1 pair of extra depth shoes and foam casted for 3 pair of insoles.   Documentation of medical necessity will be sent to patient's treating diabetic doctor to verify and sign.   Patient's diabetic provider: Lawerance Cruel, MD  Shoes and insoles will be ordered at that time and patient will be notified for an appointment for fitting when they arrive.   Shoe size (per patient): 11 medium men's    Brannock measurement: 11 men's  Patient shoe selection-   1st choice:   [id number] Apex  2nd choice:  [place] [employer]  Engineer, structural size ordered: 11 medium men's

## 2022-01-08 DIAGNOSIS — Z0000 Encounter for general adult medical examination without abnormal findings: Secondary | ICD-10-CM | POA: Diagnosis not present

## 2022-01-08 DIAGNOSIS — E7800 Pure hypercholesterolemia, unspecified: Secondary | ICD-10-CM | POA: Diagnosis not present

## 2022-01-08 DIAGNOSIS — E538 Deficiency of other specified B group vitamins: Secondary | ICD-10-CM | POA: Diagnosis not present

## 2022-01-08 DIAGNOSIS — I10 Essential (primary) hypertension: Secondary | ICD-10-CM | POA: Diagnosis not present

## 2022-01-08 DIAGNOSIS — N1831 Chronic kidney disease, stage 3a: Secondary | ICD-10-CM | POA: Diagnosis not present

## 2022-01-08 DIAGNOSIS — Z862 Personal history of diseases of the blood and blood-forming organs and certain disorders involving the immune mechanism: Secondary | ICD-10-CM | POA: Diagnosis not present

## 2022-01-08 DIAGNOSIS — E11319 Type 2 diabetes mellitus with unspecified diabetic retinopathy without macular edema: Secondary | ICD-10-CM | POA: Diagnosis not present

## 2022-02-04 DIAGNOSIS — D1801 Hemangioma of skin and subcutaneous tissue: Secondary | ICD-10-CM | POA: Diagnosis not present

## 2022-02-04 DIAGNOSIS — D2371 Other benign neoplasm of skin of right lower limb, including hip: Secondary | ICD-10-CM | POA: Diagnosis not present

## 2022-02-04 DIAGNOSIS — L570 Actinic keratosis: Secondary | ICD-10-CM | POA: Diagnosis not present

## 2022-02-04 DIAGNOSIS — L821 Other seborrheic keratosis: Secondary | ICD-10-CM | POA: Diagnosis not present

## 2022-02-04 DIAGNOSIS — Z85828 Personal history of other malignant neoplasm of skin: Secondary | ICD-10-CM | POA: Diagnosis not present

## 2022-02-04 DIAGNOSIS — D225 Melanocytic nevi of trunk: Secondary | ICD-10-CM | POA: Diagnosis not present

## 2022-02-04 DIAGNOSIS — D485 Neoplasm of uncertain behavior of skin: Secondary | ICD-10-CM | POA: Diagnosis not present

## 2022-02-04 DIAGNOSIS — L814 Other melanin hyperpigmentation: Secondary | ICD-10-CM | POA: Diagnosis not present

## 2022-03-24 DIAGNOSIS — Z961 Presence of intraocular lens: Secondary | ICD-10-CM | POA: Diagnosis not present

## 2022-03-24 DIAGNOSIS — E113291 Type 2 diabetes mellitus with mild nonproliferative diabetic retinopathy without macular edema, right eye: Secondary | ICD-10-CM | POA: Diagnosis not present

## 2022-03-24 DIAGNOSIS — H524 Presbyopia: Secondary | ICD-10-CM | POA: Diagnosis not present

## 2022-03-24 DIAGNOSIS — H26492 Other secondary cataract, left eye: Secondary | ICD-10-CM | POA: Diagnosis not present

## 2022-03-24 DIAGNOSIS — H40023 Open angle with borderline findings, high risk, bilateral: Secondary | ICD-10-CM | POA: Diagnosis not present

## 2022-03-24 DIAGNOSIS — H0100B Unspecified blepharitis left eye, upper and lower eyelids: Secondary | ICD-10-CM | POA: Diagnosis not present

## 2022-03-24 DIAGNOSIS — H0100A Unspecified blepharitis right eye, upper and lower eyelids: Secondary | ICD-10-CM | POA: Diagnosis not present

## 2022-03-24 DIAGNOSIS — H5212 Myopia, left eye: Secondary | ICD-10-CM | POA: Diagnosis not present

## 2022-04-03 ENCOUNTER — Ambulatory Visit: Payer: Medicare Other | Admitting: *Deleted

## 2022-04-03 DIAGNOSIS — M2011 Hallux valgus (acquired), right foot: Secondary | ICD-10-CM

## 2022-04-03 DIAGNOSIS — M216X2 Other acquired deformities of left foot: Secondary | ICD-10-CM

## 2022-04-03 DIAGNOSIS — E119 Type 2 diabetes mellitus without complications: Secondary | ICD-10-CM

## 2022-04-03 NOTE — Progress Notes (Signed)
Patient presents today to pick up diabetic shoes and insoles.  Patient was dispensed 1 pair of diabetic shoes and 3 pairs of foam casted diabetic insoles. Patient was pleased with shoes and he stated that the fit was satisfactory.

## 2022-04-07 DIAGNOSIS — N1831 Chronic kidney disease, stage 3a: Secondary | ICD-10-CM | POA: Diagnosis not present

## 2022-04-23 ENCOUNTER — Ambulatory Visit: Payer: Medicare Other | Admitting: Podiatry

## 2022-05-19 ENCOUNTER — Encounter: Payer: Self-pay | Admitting: Podiatry

## 2022-05-19 ENCOUNTER — Ambulatory Visit: Payer: Medicare Other | Admitting: Podiatry

## 2022-05-19 DIAGNOSIS — N1831 Chronic kidney disease, stage 3a: Secondary | ICD-10-CM

## 2022-05-19 DIAGNOSIS — B351 Tinea unguium: Secondary | ICD-10-CM

## 2022-05-19 DIAGNOSIS — E119 Type 2 diabetes mellitus without complications: Secondary | ICD-10-CM

## 2022-05-19 DIAGNOSIS — M79675 Pain in left toe(s): Secondary | ICD-10-CM | POA: Diagnosis not present

## 2022-05-19 DIAGNOSIS — M216X2 Other acquired deformities of left foot: Secondary | ICD-10-CM

## 2022-05-19 DIAGNOSIS — M79674 Pain in right toe(s): Secondary | ICD-10-CM

## 2022-05-19 NOTE — Progress Notes (Signed)
This patient returns to my office for at risk foot care.  This patient requires this care by a professional since this patient will be at risk due to having diabetes.  This patient is unable to cut nails himself since the patient cannot reach his nails.These nails are painful walking and wearing shoes.  This patient presents for at risk foot care today.  General Appearance  Alert, conversant and in no acute stress.  Vascular  Dorsalis pedis and posterior tibial  pulses are palpable  bilaterally.  Capillary return is within normal limits  bilaterally. Temperature is within normal limits  bilaterally.  Neurologic  Senn-Weinstein monofilament wire test diminished   bilaterally. Muscle power within normal limits bilaterally.  Nails Long thick ingrown toenails  B/L. No evidence of bacterial infection or drainage bilaterally.  Orthopedic  No limitations of motion  feet .  No crepitus or effusions noted.  No bony pathology or digital deformities noted.  Skin  normotropic skin  noted bilaterally.  No signs of infections or ulcers noted.   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    Onychomycosis  Pain in right toes  Pain in left toes  Porokeratosis sub 5th left foot.  Consent was obtained for treatment procedures.   Mechanical debridement of nails 1-5  bilaterally performed with a nail nipper.  Filed with dremel without incident. Debridement of porokeratosis with # 15 blade.  Patient to come back for diabetic shoes.   Return office visit     3 months                 Told patient to return for periodic foot care and evaluation due to potential at risk complications.   Gardiner Barefoot DPM

## 2022-07-10 DIAGNOSIS — E1122 Type 2 diabetes mellitus with diabetic chronic kidney disease: Secondary | ICD-10-CM | POA: Diagnosis not present

## 2022-07-10 DIAGNOSIS — I10 Essential (primary) hypertension: Secondary | ICD-10-CM | POA: Diagnosis not present

## 2022-07-10 DIAGNOSIS — E11319 Type 2 diabetes mellitus with unspecified diabetic retinopathy without macular edema: Secondary | ICD-10-CM | POA: Diagnosis not present

## 2022-07-10 DIAGNOSIS — N1831 Chronic kidney disease, stage 3a: Secondary | ICD-10-CM | POA: Diagnosis not present

## 2022-07-17 DIAGNOSIS — E11319 Type 2 diabetes mellitus with unspecified diabetic retinopathy without macular edema: Secondary | ICD-10-CM | POA: Diagnosis not present

## 2022-07-17 DIAGNOSIS — I10 Essential (primary) hypertension: Secondary | ICD-10-CM | POA: Diagnosis not present

## 2022-07-17 DIAGNOSIS — N1831 Chronic kidney disease, stage 3a: Secondary | ICD-10-CM | POA: Diagnosis not present

## 2022-07-17 DIAGNOSIS — E7800 Pure hypercholesterolemia, unspecified: Secondary | ICD-10-CM | POA: Diagnosis not present

## 2022-07-23 IMAGING — DX DG FINGER THUMB 2+V*L*
3 series · 3 of 3 positions shown · non-contrast
Comparison: None

CLINICAL DATA: Fell this weekend, LEFT thumb pain

EXAM:
LEFT THUMB 2+V

[finger ap]
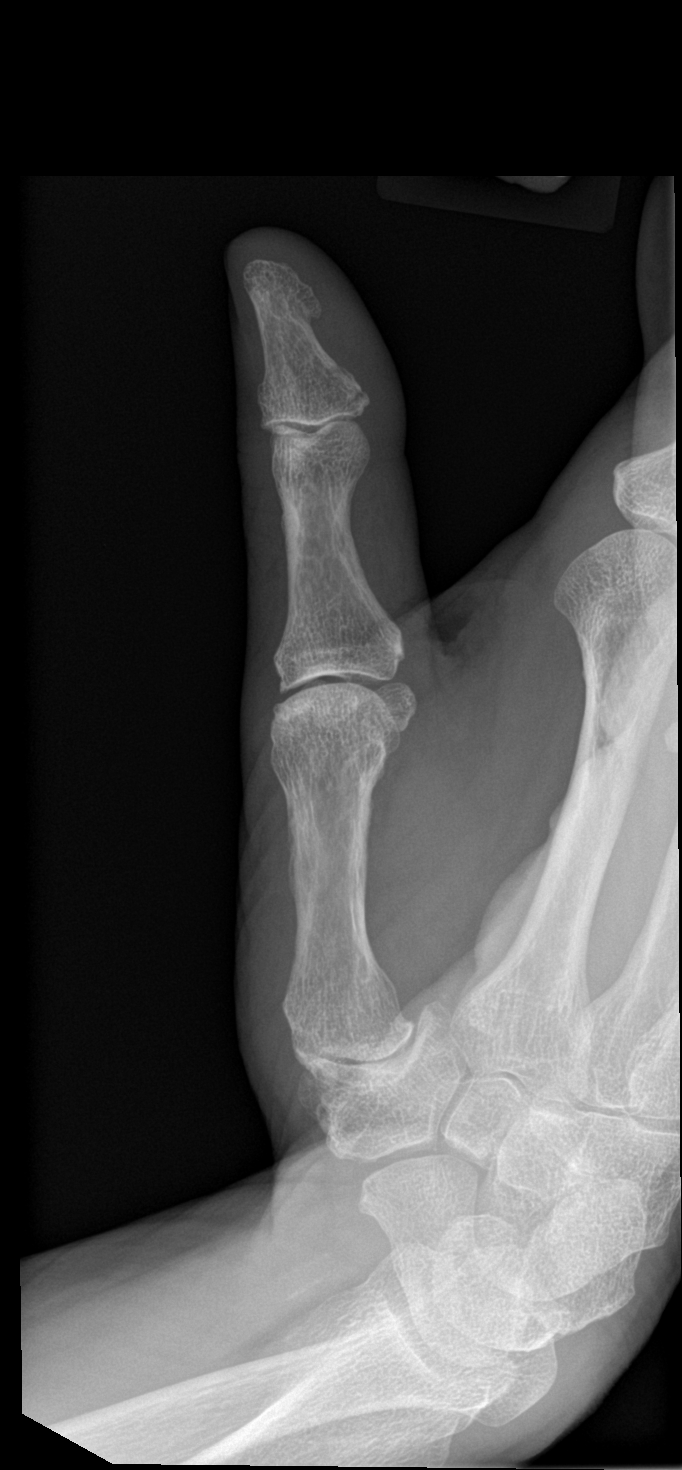

[finger obl]
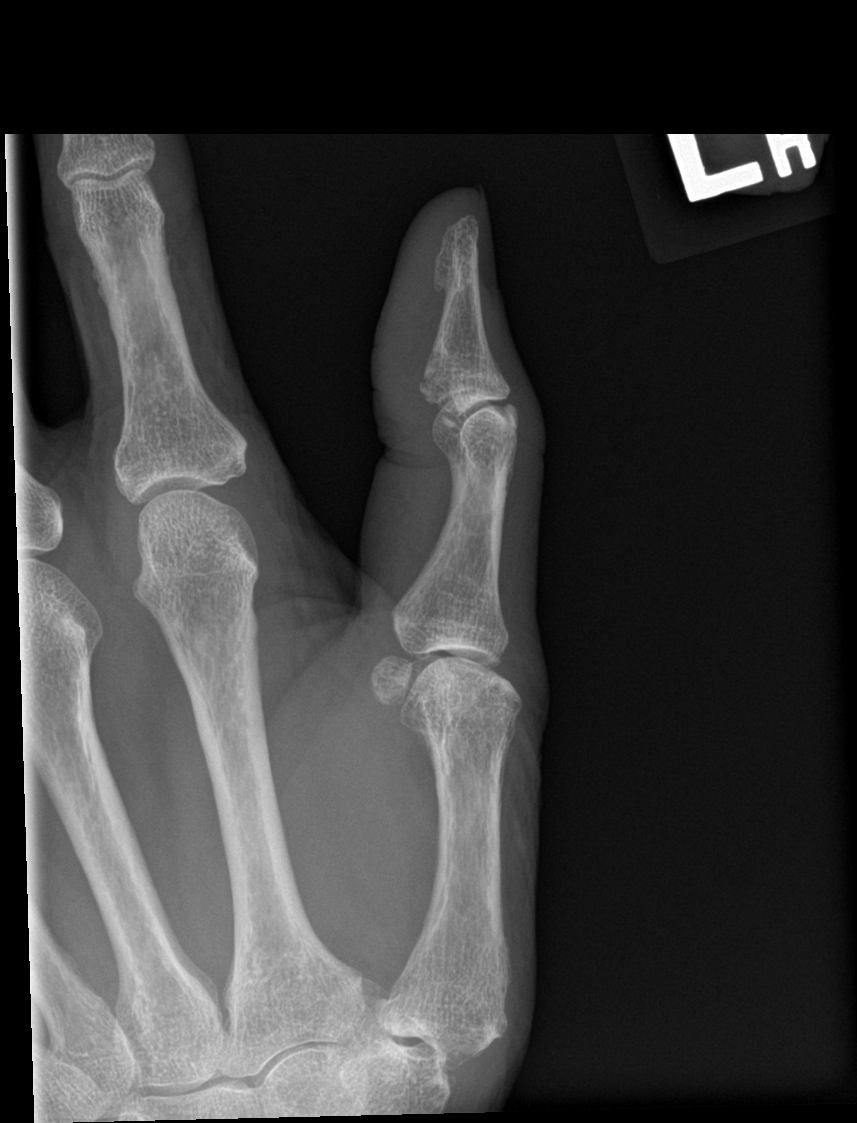

[finger lat]
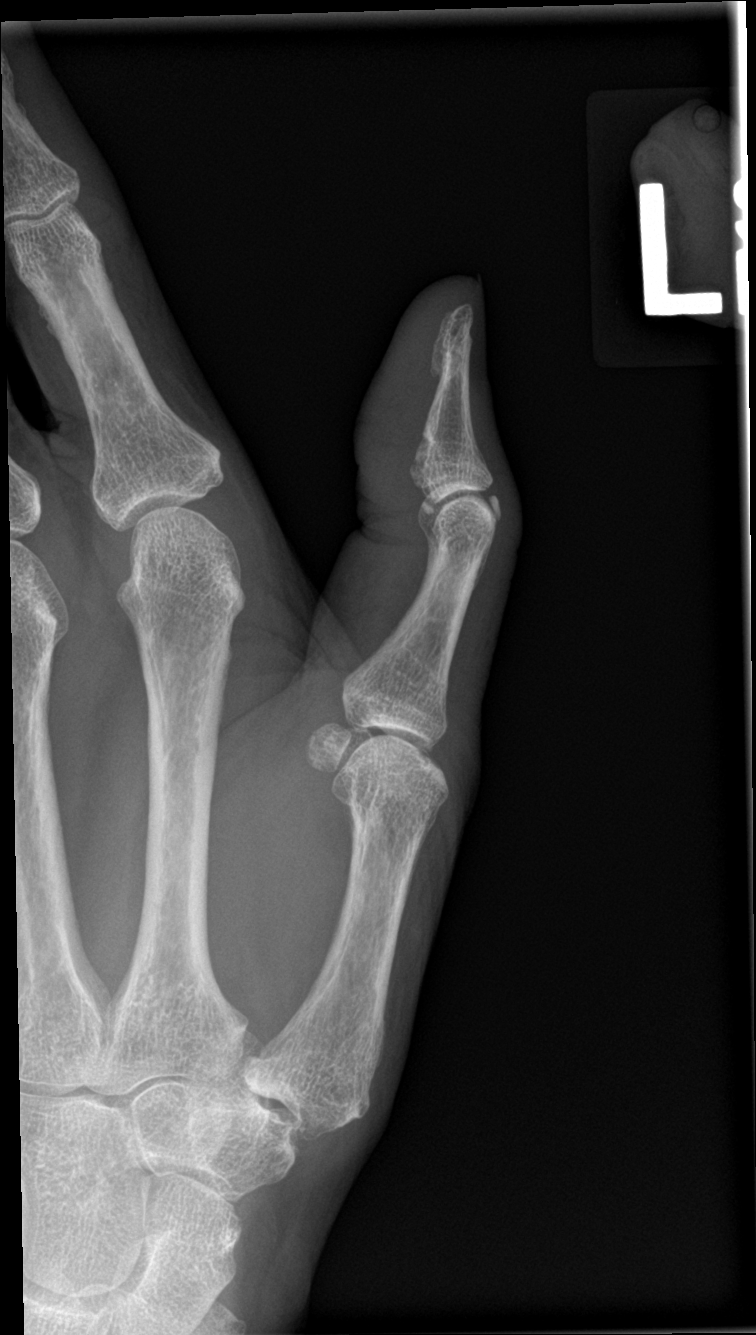

[3 of 3 positions shown; findings below may reference images not displayed]

FINDINGS: Osseous mineralization normal.

Mild degenerative changes first MCP joint.

Nondisplaced volar plate avulsion fracture.

No additional fracture, dislocation, or bone destruction.
IMPRESSION: Nondisplaced volar plate avulsion fracture, base of distal phalanx
LEFT thumb.

## 2022-08-19 ENCOUNTER — Encounter: Payer: Self-pay | Admitting: Podiatry

## 2022-08-19 ENCOUNTER — Ambulatory Visit: Payer: Medicare Other | Admitting: Podiatry

## 2022-08-19 DIAGNOSIS — B351 Tinea unguium: Secondary | ICD-10-CM

## 2022-08-19 DIAGNOSIS — M216X2 Other acquired deformities of left foot: Secondary | ICD-10-CM

## 2022-08-19 DIAGNOSIS — M79675 Pain in left toe(s): Secondary | ICD-10-CM | POA: Diagnosis not present

## 2022-08-19 DIAGNOSIS — E119 Type 2 diabetes mellitus without complications: Secondary | ICD-10-CM

## 2022-08-19 DIAGNOSIS — M79674 Pain in right toe(s): Secondary | ICD-10-CM

## 2022-08-19 DIAGNOSIS — Q828 Other specified congenital malformations of skin: Secondary | ICD-10-CM | POA: Diagnosis not present

## 2022-08-19 NOTE — Progress Notes (Signed)
This patient returns to my office for at risk foot care.  This patient requires this care by a professional since this patient will be at risk due to having diabetes.  This patient is unable to cut nails himself since the patient cannot reach his nails.These nails are painful walking and wearing shoes.  This patient presents for at risk foot care today.  General Appearance  Alert, conversant and in no acute stress.  Vascular  Dorsalis pedis and posterior tibial  pulses are palpable  bilaterally.  Capillary return is within normal limits  bilaterally. Temperature is within normal limits  bilaterally.  Neurologic  Senn-Weinstein monofilament wire test diminished   bilaterally. Muscle power within normal limits bilaterally.  Nails Long thick ingrown toenails  B/L. No evidence of bacterial infection or drainage bilaterally.  Orthopedic  No limitations of motion  feet .  No crepitus or effusions noted.  No bony pathology or digital deformities noted.  Skin  normotropic skin  noted bilaterally.  No signs of infections or ulcers noted.   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    Onychomycosis  Pain in right toes  Pain in left toes  Porokeratosis sub 5th left foot.  Consent was obtained for treatment procedures.   Mechanical debridement of nails 1-5  bilaterally performed with a nail nipper.  Filed with dremel without incident. Debridement of porokeratosis with # 15 blade.  To consider surgery fro removal fifth met head left foot.   Return office visit     3 months                 Told patient to return for periodic foot care and evaluation due to potential at risk complications.   Gardiner Barefoot DPM

## 2022-09-30 DIAGNOSIS — H40023 Open angle with borderline findings, high risk, bilateral: Secondary | ICD-10-CM | POA: Diagnosis not present

## 2022-10-15 ENCOUNTER — Telehealth: Payer: Self-pay | Admitting: Podiatry

## 2022-10-15 NOTE — Telephone Encounter (Signed)
Left message on vm that shoes have come in . Needs appt to try them on

## 2022-11-24 ENCOUNTER — Encounter: Payer: Self-pay | Admitting: Podiatry

## 2022-11-24 ENCOUNTER — Ambulatory Visit: Payer: Medicare Other | Admitting: Podiatry

## 2022-11-24 DIAGNOSIS — M79674 Pain in right toe(s): Secondary | ICD-10-CM

## 2022-11-24 DIAGNOSIS — E119 Type 2 diabetes mellitus without complications: Secondary | ICD-10-CM

## 2022-11-24 DIAGNOSIS — M79675 Pain in left toe(s): Secondary | ICD-10-CM | POA: Diagnosis not present

## 2022-11-24 DIAGNOSIS — B351 Tinea unguium: Secondary | ICD-10-CM

## 2022-11-24 NOTE — Progress Notes (Signed)
This patient returns to my office for at risk foot care.  This patient requires this care by a professional since this patient will be at risk due to having diabetes.  This patient is unable to cut nails himself since the patient cannot reach his nails.These nails are painful walking and wearing shoes.  This patient presents for at risk foot care today.  General Appearance  Alert, conversant and in no acute stress.  Vascular  Dorsalis pedis and posterior tibial  pulses are palpable  bilaterally.  Capillary return is within normal limits  bilaterally. Temperature is within normal limits  bilaterally.  Neurologic  Senn-Weinstein monofilament wire test diminished   bilaterally. Muscle power within normal limits bilaterally.  Nails Long thick ingrown toenails  B/L. No evidence of bacterial infection or drainage bilaterally.  Orthopedic  No limitations of motion  feet .  No crepitus or effusions noted.  No bony pathology or digital deformities noted.  Skin  normotropic skin  noted bilaterally.  No signs of infections or ulcers noted.   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    Onychomycosis  Pain in right toes  Pain in left toes  Porokeratosis sub 5th left foot.  Consent was obtained for treatment procedures.   Mechanical debridement of nails 1-5  bilaterally performed with a nail nipper.  Filed with dremel without incident. Debridement of porokeratosis with # 15 blade.  To consider surgery fro removal fifth met head left foot.   Return office visit     3 months                 Told patient to return for periodic foot care and evaluation due to potential at risk complications.   Yuvin Bussiere DPM  

## 2022-11-27 DIAGNOSIS — H903 Sensorineural hearing loss, bilateral: Secondary | ICD-10-CM | POA: Diagnosis not present

## 2023-01-27 DIAGNOSIS — Z862 Personal history of diseases of the blood and blood-forming organs and certain disorders involving the immune mechanism: Secondary | ICD-10-CM | POA: Diagnosis not present

## 2023-01-27 DIAGNOSIS — E7800 Pure hypercholesterolemia, unspecified: Secondary | ICD-10-CM | POA: Diagnosis not present

## 2023-01-27 DIAGNOSIS — E11319 Type 2 diabetes mellitus with unspecified diabetic retinopathy without macular edema: Secondary | ICD-10-CM | POA: Diagnosis not present

## 2023-01-27 DIAGNOSIS — E538 Deficiency of other specified B group vitamins: Secondary | ICD-10-CM | POA: Diagnosis not present

## 2023-01-27 DIAGNOSIS — I10 Essential (primary) hypertension: Secondary | ICD-10-CM | POA: Diagnosis not present

## 2023-02-05 DIAGNOSIS — D1801 Hemangioma of skin and subcutaneous tissue: Secondary | ICD-10-CM | POA: Diagnosis not present

## 2023-02-05 DIAGNOSIS — L821 Other seborrheic keratosis: Secondary | ICD-10-CM | POA: Diagnosis not present

## 2023-02-05 DIAGNOSIS — Z85828 Personal history of other malignant neoplasm of skin: Secondary | ICD-10-CM | POA: Diagnosis not present

## 2023-02-05 DIAGNOSIS — D0339 Melanoma in situ of other parts of face: Secondary | ICD-10-CM | POA: Diagnosis not present

## 2023-02-05 DIAGNOSIS — L814 Other melanin hyperpigmentation: Secondary | ICD-10-CM | POA: Diagnosis not present

## 2023-02-05 DIAGNOSIS — L570 Actinic keratosis: Secondary | ICD-10-CM | POA: Diagnosis not present

## 2023-02-09 DIAGNOSIS — E1122 Type 2 diabetes mellitus with diabetic chronic kidney disease: Secondary | ICD-10-CM | POA: Diagnosis not present

## 2023-02-09 DIAGNOSIS — Z0000 Encounter for general adult medical examination without abnormal findings: Secondary | ICD-10-CM | POA: Diagnosis not present

## 2023-02-09 DIAGNOSIS — E11319 Type 2 diabetes mellitus with unspecified diabetic retinopathy without macular edema: Secondary | ICD-10-CM | POA: Diagnosis not present

## 2023-02-09 DIAGNOSIS — E7800 Pure hypercholesterolemia, unspecified: Secondary | ICD-10-CM | POA: Diagnosis not present

## 2023-02-09 DIAGNOSIS — I10 Essential (primary) hypertension: Secondary | ICD-10-CM | POA: Diagnosis not present

## 2023-02-24 ENCOUNTER — Ambulatory Visit: Payer: Medicare Other | Admitting: Podiatry

## 2023-02-24 ENCOUNTER — Encounter: Payer: Self-pay | Admitting: Podiatry

## 2023-02-24 DIAGNOSIS — B351 Tinea unguium: Secondary | ICD-10-CM

## 2023-02-24 DIAGNOSIS — M216X2 Other acquired deformities of left foot: Secondary | ICD-10-CM | POA: Diagnosis not present

## 2023-02-24 DIAGNOSIS — M79675 Pain in left toe(s): Secondary | ICD-10-CM | POA: Diagnosis not present

## 2023-02-24 DIAGNOSIS — Q828 Other specified congenital malformations of skin: Secondary | ICD-10-CM | POA: Diagnosis not present

## 2023-02-24 DIAGNOSIS — E119 Type 2 diabetes mellitus without complications: Secondary | ICD-10-CM | POA: Diagnosis not present

## 2023-02-24 DIAGNOSIS — M79674 Pain in right toe(s): Secondary | ICD-10-CM | POA: Diagnosis not present

## 2023-02-24 NOTE — Progress Notes (Signed)
This patient returns to my office for at risk foot care.  This patient requires this care by a professional since this patient will be at risk due to having diabetes.  This patient is unable to cut nails himself since the patient cannot reach his nails.These nails are painful walking and wearing shoes.  This patient presents for at risk foot care today.  General Appearance  Alert, conversant and in no acute stress.  Vascular  Dorsalis pedis and posterior tibial  pulses are palpable  bilaterally.  Capillary return is within normal limits  bilaterally. Temperature is within normal limits  bilaterally.  Neurologic  Senn-Weinstein monofilament wire test diminished   bilaterally. Muscle power within normal limits bilaterally.  Nails Long thick ingrown toenails  B/L. No evidence of bacterial infection or drainage bilaterally.  Orthopedic  No limitations of motion  feet .  No crepitus or effusions noted.  No bony pathology or digital deformities noted. Plantar flex 5th met left foot.  Skin  normotropic skin  noted bilaterally.  No signs of infections or ulcers noted.   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    Onychomycosis  Pain in right toes  Pain in left toes  Porokeratosis sub 5th left foot.  Consent was obtained for treatment procedures.   Mechanical debridement of nails 1-5  bilaterally performed with a nail nipper.  Filed with dremel without incident. Debridement of porokeratosis with # 15 blade.     Return office visit     3 months                 Told patient to return for periodic foot care and evaluation due to potential at risk complications.   Helane Gunther DPM

## 2023-04-01 DIAGNOSIS — H26492 Other secondary cataract, left eye: Secondary | ICD-10-CM | POA: Diagnosis not present

## 2023-04-01 DIAGNOSIS — H04123 Dry eye syndrome of bilateral lacrimal glands: Secondary | ICD-10-CM | POA: Diagnosis not present

## 2023-04-01 DIAGNOSIS — H40023 Open angle with borderline findings, high risk, bilateral: Secondary | ICD-10-CM | POA: Diagnosis not present

## 2023-04-01 DIAGNOSIS — E119 Type 2 diabetes mellitus without complications: Secondary | ICD-10-CM | POA: Diagnosis not present

## 2023-04-13 DIAGNOSIS — D0339 Melanoma in situ of other parts of face: Secondary | ICD-10-CM | POA: Diagnosis not present

## 2023-04-13 DIAGNOSIS — L988 Other specified disorders of the skin and subcutaneous tissue: Secondary | ICD-10-CM | POA: Diagnosis not present

## 2023-04-14 DIAGNOSIS — D0339 Melanoma in situ of other parts of face: Secondary | ICD-10-CM | POA: Diagnosis not present

## 2023-05-26 ENCOUNTER — Ambulatory Visit: Payer: Medicare Other | Admitting: Podiatry

## 2023-06-12 ENCOUNTER — Ambulatory Visit: Payer: Medicare Other | Admitting: Podiatry

## 2023-07-13 ENCOUNTER — Encounter: Payer: Self-pay | Admitting: Podiatry

## 2023-07-13 ENCOUNTER — Ambulatory Visit: Payer: Medicare Other | Admitting: Podiatry

## 2023-07-13 DIAGNOSIS — B351 Tinea unguium: Secondary | ICD-10-CM

## 2023-07-13 DIAGNOSIS — E119 Type 2 diabetes mellitus without complications: Secondary | ICD-10-CM | POA: Diagnosis not present

## 2023-07-13 DIAGNOSIS — M79675 Pain in left toe(s): Secondary | ICD-10-CM | POA: Diagnosis not present

## 2023-07-13 DIAGNOSIS — M79674 Pain in right toe(s): Secondary | ICD-10-CM | POA: Diagnosis not present

## 2023-07-13 NOTE — Progress Notes (Signed)
This patient returns to my office for at risk foot care.  This patient requires this care by a professional since this patient will be at risk due to having diabetes.  This patient is unable to cut nails himself since the patient cannot reach his nails.These nails are painful walking and wearing shoes.  This patient presents for at risk foot care today.  General Appearance  Alert, conversant and in no acute stress.  Vascular  Dorsalis pedis and posterior tibial  pulses are palpable  bilaterally.  Capillary return is within normal limits  bilaterally. Temperature is within normal limits  bilaterally.  Neurologic  Senn-Weinstein monofilament wire test diminished   bilaterally. Muscle power within normal limits bilaterally.  Nails Long thick ingrown toenails  B/L. No evidence of bacterial infection or drainage bilaterally.  Orthopedic  No limitations of motion  feet .  No crepitus or effusions noted.  No bony pathology or digital deformities noted. Plantar flex 5th met left foot.  Skin  normotropic skin  noted bilaterally.  No signs of infections or ulcers noted.   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    Onychomycosis  Pain in right toes  Pain in left toes    Consent was obtained for treatment procedures.   Mechanical debridement of nails 1-5  bilaterally performed with a nail nipper.  Filed with dremel without incident    Return office visit     3 months                 Told patient to return for periodic foot care and evaluation due to potential at risk complications.   Helane Gunther DPM

## 2023-08-05 DIAGNOSIS — E11319 Type 2 diabetes mellitus with unspecified diabetic retinopathy without macular edema: Secondary | ICD-10-CM | POA: Diagnosis not present

## 2023-08-05 DIAGNOSIS — I10 Essential (primary) hypertension: Secondary | ICD-10-CM | POA: Diagnosis not present

## 2023-09-30 DIAGNOSIS — H40023 Open angle with borderline findings, high risk, bilateral: Secondary | ICD-10-CM | POA: Diagnosis not present

## 2023-09-30 DIAGNOSIS — H04123 Dry eye syndrome of bilateral lacrimal glands: Secondary | ICD-10-CM | POA: Diagnosis not present

## 2023-10-12 ENCOUNTER — Ambulatory Visit: Payer: Medicare Other | Admitting: Podiatry

## 2023-10-12 ENCOUNTER — Encounter: Payer: Self-pay | Admitting: Podiatry

## 2023-10-12 DIAGNOSIS — E119 Type 2 diabetes mellitus without complications: Secondary | ICD-10-CM | POA: Diagnosis not present

## 2023-10-12 DIAGNOSIS — M79675 Pain in left toe(s): Secondary | ICD-10-CM

## 2023-10-12 DIAGNOSIS — M79674 Pain in right toe(s): Secondary | ICD-10-CM

## 2023-10-12 DIAGNOSIS — B351 Tinea unguium: Secondary | ICD-10-CM

## 2023-10-12 NOTE — Progress Notes (Signed)
 This patient returns to my office for at risk foot care.  This patient requires this care by a professional since this patient will be at risk due to having diabetes.  This patient is unable to cut nails himself since the patient cannot reach his nails.These nails are painful walking and wearing shoes.  This patient presents for at risk foot care today.  General Appearance  Alert, conversant and in no acute stress.  Vascular  Dorsalis pedis and posterior tibial  pulses are palpable  bilaterally.  Capillary return is within normal limits  bilaterally. Temperature is within normal limits  bilaterally.  Neurologic  Senn-Weinstein monofilament wire test diminished   bilaterally. Muscle power within normal limits bilaterally.  Nails Long thick ingrown toenails  B/L. No evidence of bacterial infection or drainage bilaterally.  Orthopedic  No limitations of motion  feet .  No crepitus or effusions noted.  No bony pathology or digital deformities noted. Plantar flex 5th met left foot.  Skin  normotropic skin  noted bilaterally.  No signs of infections or ulcers noted.   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    Onychomycosis  Pain in right toes  Pain in left toes    Consent was obtained for treatment procedures.   Mechanical debridement of nails 1-5  bilaterally performed with a nail nipper.  Filed with dremel without incident    Return office visit     3 months                 Told patient to return for periodic foot care and evaluation due to potential at risk complications.   Helane Gunther DPM

## 2024-01-11 ENCOUNTER — Ambulatory Visit: Admitting: Podiatry

## 2024-01-11 ENCOUNTER — Encounter: Payer: Self-pay | Admitting: Podiatry

## 2024-01-11 DIAGNOSIS — M79674 Pain in right toe(s): Secondary | ICD-10-CM | POA: Diagnosis not present

## 2024-01-11 DIAGNOSIS — L84 Corns and callosities: Secondary | ICD-10-CM

## 2024-01-11 DIAGNOSIS — B351 Tinea unguium: Secondary | ICD-10-CM

## 2024-01-11 DIAGNOSIS — M79675 Pain in left toe(s): Secondary | ICD-10-CM | POA: Diagnosis not present

## 2024-01-11 DIAGNOSIS — E119 Type 2 diabetes mellitus without complications: Secondary | ICD-10-CM | POA: Diagnosis not present

## 2024-01-11 NOTE — Progress Notes (Signed)
 This patient returns to my office for at risk foot care.  This patient requires this care by a professional since this patient will be at risk due to having diabetes.  This patient is unable to cut nails himself since the patient cannot reach his nails.These nails are painful walking and wearing shoes.  This patient presents for at risk foot care today.  General Appearance  Alert, conversant and in no acute stress.  Vascular  Dorsalis pedis and posterior tibial  pulses are palpable  bilaterally.  Capillary return is within normal limits  bilaterally. Temperature is within normal limits  bilaterally.  Neurologic  Senn-Weinstein monofilament wire test diminished   bilaterally. Muscle power within normal limits bilaterally.  Nails Long thick ingrown toenails  B/L. No evidence of bacterial infection or drainage bilaterally.  Orthopedic  No limitations of motion  feet .  No crepitus or effusions noted.  No bony pathology or digital deformities noted. Plantar flex 5th met left foot.  Skin  normotropic skin  noted bilaterally.  No signs of infections or ulcers noted.   Porokeratosis sub 5th left  foot.    Onychomycosis  Pain in right toes  Pain in left toes  Callus left foot.  Consent was obtained for treatment procedures.   Mechanical debridement of nails 1-5  bilaterally performed with a nail nipper.  Filed with dremel without incident Cebride callus with # 15 blade and dremel tool.   Return office visit     3 months                 Told patient to return for periodic foot care and evaluation due to potential at risk complications.   Cordella Bold DPM

## 2024-02-02 DIAGNOSIS — H04123 Dry eye syndrome of bilateral lacrimal glands: Secondary | ICD-10-CM | POA: Diagnosis not present

## 2024-02-02 DIAGNOSIS — H26492 Other secondary cataract, left eye: Secondary | ICD-10-CM | POA: Diagnosis not present

## 2024-02-02 DIAGNOSIS — E113293 Type 2 diabetes mellitus with mild nonproliferative diabetic retinopathy without macular edema, bilateral: Secondary | ICD-10-CM | POA: Diagnosis not present

## 2024-02-02 DIAGNOSIS — H40023 Open angle with borderline findings, high risk, bilateral: Secondary | ICD-10-CM | POA: Diagnosis not present

## 2024-02-10 DIAGNOSIS — Z85820 Personal history of malignant melanoma of skin: Secondary | ICD-10-CM | POA: Diagnosis not present

## 2024-02-10 DIAGNOSIS — L720 Epidermal cyst: Secondary | ICD-10-CM | POA: Diagnosis not present

## 2024-02-10 DIAGNOSIS — E7800 Pure hypercholesterolemia, unspecified: Secondary | ICD-10-CM | POA: Diagnosis not present

## 2024-02-10 DIAGNOSIS — L570 Actinic keratosis: Secondary | ICD-10-CM | POA: Diagnosis not present

## 2024-02-10 DIAGNOSIS — L821 Other seborrheic keratosis: Secondary | ICD-10-CM | POA: Diagnosis not present

## 2024-02-10 DIAGNOSIS — N1831 Chronic kidney disease, stage 3a: Secondary | ICD-10-CM | POA: Diagnosis not present

## 2024-02-10 DIAGNOSIS — Z0000 Encounter for general adult medical examination without abnormal findings: Secondary | ICD-10-CM | POA: Diagnosis not present

## 2024-02-10 DIAGNOSIS — D225 Melanocytic nevi of trunk: Secondary | ICD-10-CM | POA: Diagnosis not present

## 2024-02-10 DIAGNOSIS — B078 Other viral warts: Secondary | ICD-10-CM | POA: Diagnosis not present

## 2024-02-10 DIAGNOSIS — Z23 Encounter for immunization: Secondary | ICD-10-CM | POA: Diagnosis not present

## 2024-02-10 DIAGNOSIS — D1801 Hemangioma of skin and subcutaneous tissue: Secondary | ICD-10-CM | POA: Diagnosis not present

## 2024-02-10 DIAGNOSIS — Z862 Personal history of diseases of the blood and blood-forming organs and certain disorders involving the immune mechanism: Secondary | ICD-10-CM | POA: Diagnosis not present

## 2024-02-10 DIAGNOSIS — E11319 Type 2 diabetes mellitus with unspecified diabetic retinopathy without macular edema: Secondary | ICD-10-CM | POA: Diagnosis not present

## 2024-02-10 DIAGNOSIS — E538 Deficiency of other specified B group vitamins: Secondary | ICD-10-CM | POA: Diagnosis not present

## 2024-02-10 DIAGNOSIS — I10 Essential (primary) hypertension: Secondary | ICD-10-CM | POA: Diagnosis not present

## 2024-02-10 DIAGNOSIS — D692 Other nonthrombocytopenic purpura: Secondary | ICD-10-CM | POA: Diagnosis not present

## 2024-04-12 ENCOUNTER — Ambulatory Visit: Admitting: Podiatry

## 2024-07-11 ENCOUNTER — Ambulatory Visit: Admitting: Podiatry

## 2024-07-11 ENCOUNTER — Encounter: Payer: Self-pay | Admitting: Podiatry

## 2024-07-11 DIAGNOSIS — E119 Type 2 diabetes mellitus without complications: Secondary | ICD-10-CM | POA: Diagnosis not present

## 2024-07-11 DIAGNOSIS — M79674 Pain in right toe(s): Secondary | ICD-10-CM | POA: Diagnosis not present

## 2024-07-11 DIAGNOSIS — M79675 Pain in left toe(s): Secondary | ICD-10-CM | POA: Diagnosis not present

## 2024-07-11 DIAGNOSIS — B351 Tinea unguium: Secondary | ICD-10-CM

## 2024-07-11 NOTE — Progress Notes (Signed)
 This patient returns to my office for at risk foot care.  This patient requires this care by a professional since this patient will be at risk due to having diabetes.  This patient is unable to cut nails himself since the patient cannot reach his nails.These nails are painful walking and wearing shoes.  This patient presents for at risk foot care today.  General Appearance  Alert, conversant and in no acute stress.  Vascular  Dorsalis pedis and posterior tibial  pulses are palpable  bilaterally.  Capillary return is within normal limits  bilaterally. Temperature is within normal limits  bilaterally.  Neurologic  Senn-Weinstein monofilament wire test diminished   bilaterally. Muscle power within normal limits bilaterally.  Nails Long thick ingrown toenails  B/L. No evidence of bacterial infection or drainage bilaterally.  Orthopedic  No limitations of motion  feet .  No crepitus or effusions noted.  No bony pathology or digital deformities noted. Plantar flex 5th met left foot.  Skin  normotropic skin  noted bilaterally.  No signs of infections or ulcers noted.   Porokeratosis sub 5th left  foot.    Onychomycosis  Pain in right toes  Pain in left toes  Callus left foot.  Consent was obtained for treatment procedures.   Mechanical debridement of nails 1-5  bilaterally performed with a nail nipper.  Filed with dremel without incident    Return office visit     4months                 Told patient to return for periodic foot care and evaluation due to potential at risk complications.   Cordella Bold DPM

## 2024-11-08 ENCOUNTER — Ambulatory Visit: Admitting: Podiatry
